# Patient Record
Sex: Female | Born: 1935 | Race: White | Hispanic: No | Marital: Married | Smoking: Former smoker
Health system: Southern US, Community
[De-identification: ages and names within clinical notes are randomized; demographics above are authoritative.]

## PROBLEM LIST (undated history)

## (undated) DIAGNOSIS — R569 Unspecified convulsions: Secondary | ICD-10-CM

## (undated) DIAGNOSIS — Z9289 Personal history of other medical treatment: Secondary | ICD-10-CM

## (undated) DIAGNOSIS — I10 Essential (primary) hypertension: Secondary | ICD-10-CM

## (undated) DIAGNOSIS — C801 Malignant (primary) neoplasm, unspecified: Secondary | ICD-10-CM

## (undated) DIAGNOSIS — I639 Cerebral infarction, unspecified: Secondary | ICD-10-CM

## (undated) DIAGNOSIS — K219 Gastro-esophageal reflux disease without esophagitis: Secondary | ICD-10-CM

## (undated) DIAGNOSIS — I509 Heart failure, unspecified: Secondary | ICD-10-CM

## (undated) DIAGNOSIS — G4730 Sleep apnea, unspecified: Secondary | ICD-10-CM

## (undated) DIAGNOSIS — D649 Anemia, unspecified: Secondary | ICD-10-CM

## (undated) DIAGNOSIS — E559 Vitamin D deficiency, unspecified: Secondary | ICD-10-CM

## (undated) DIAGNOSIS — M1990 Unspecified osteoarthritis, unspecified site: Secondary | ICD-10-CM

## (undated) DIAGNOSIS — E039 Hypothyroidism, unspecified: Secondary | ICD-10-CM

## (undated) DIAGNOSIS — E079 Disorder of thyroid, unspecified: Secondary | ICD-10-CM

## (undated) DIAGNOSIS — R0602 Shortness of breath: Secondary | ICD-10-CM

## (undated) DIAGNOSIS — R7309 Other abnormal glucose: Secondary | ICD-10-CM

## (undated) DIAGNOSIS — I609 Nontraumatic subarachnoid hemorrhage, unspecified: Secondary | ICD-10-CM

## (undated) DIAGNOSIS — E785 Hyperlipidemia, unspecified: Secondary | ICD-10-CM

## (undated) DIAGNOSIS — G2581 Restless legs syndrome: Secondary | ICD-10-CM

## (undated) HISTORY — PX: TONSILLECTOMY: SUR1361

## (undated) HISTORY — PX: KNEE SURGERY: SHX244

## (undated) HISTORY — PX: EYE SURGERY: SHX253

## (undated) HISTORY — DX: Hypothyroidism, unspecified: E03.9

## (undated) HISTORY — PX: OTHER SURGICAL HISTORY: SHX169

## (undated) HISTORY — DX: Nontraumatic subarachnoid hemorrhage, unspecified: I60.9

## (undated) HISTORY — DX: Restless legs syndrome: G25.81

## (undated) HISTORY — PX: ABDOMINAL HYSTERECTOMY: SHX81

## (undated) HISTORY — DX: Vitamin D deficiency, unspecified: E55.9

## (undated) HISTORY — DX: Hyperlipidemia, unspecified: E78.5

## (undated) HISTORY — DX: Other abnormal glucose: R73.09

---

## 1958-10-25 HISTORY — PX: APPENDECTOMY: SHX54

## 1971-10-26 DIAGNOSIS — I609 Nontraumatic subarachnoid hemorrhage, unspecified: Secondary | ICD-10-CM

## 1971-10-26 HISTORY — DX: Nontraumatic subarachnoid hemorrhage, unspecified: I60.9

## 2000-05-03 ENCOUNTER — Ambulatory Visit (HOSPITAL_COMMUNITY): Admission: RE | Admit: 2000-05-03 | Discharge: 2000-05-03 | Payer: Self-pay | Admitting: Internal Medicine

## 2000-05-03 ENCOUNTER — Encounter: Payer: Self-pay | Admitting: Internal Medicine

## 2001-09-14 ENCOUNTER — Other Ambulatory Visit: Admission: RE | Admit: 2001-09-14 | Discharge: 2001-09-14 | Payer: Self-pay | Admitting: Internal Medicine

## 2001-12-13 ENCOUNTER — Encounter (INDEPENDENT_AMBULATORY_CARE_PROVIDER_SITE_OTHER): Payer: Self-pay

## 2001-12-13 ENCOUNTER — Ambulatory Visit (HOSPITAL_COMMUNITY): Admission: RE | Admit: 2001-12-13 | Discharge: 2001-12-13 | Payer: Self-pay | Admitting: Gastroenterology

## 2003-03-05 ENCOUNTER — Ambulatory Visit (HOSPITAL_COMMUNITY): Admission: RE | Admit: 2003-03-05 | Discharge: 2003-03-05 | Payer: Self-pay | Admitting: Gastroenterology

## 2003-03-05 ENCOUNTER — Encounter (INDEPENDENT_AMBULATORY_CARE_PROVIDER_SITE_OTHER): Payer: Self-pay | Admitting: Specialist

## 2004-09-22 ENCOUNTER — Ambulatory Visit (HOSPITAL_COMMUNITY): Admission: RE | Admit: 2004-09-22 | Discharge: 2004-09-22 | Payer: Self-pay | Admitting: Internal Medicine

## 2004-12-14 ENCOUNTER — Ambulatory Visit (HOSPITAL_COMMUNITY): Admission: RE | Admit: 2004-12-14 | Discharge: 2004-12-14 | Payer: Self-pay | Admitting: Internal Medicine

## 2006-08-10 ENCOUNTER — Ambulatory Visit (HOSPITAL_COMMUNITY): Admission: RE | Admit: 2006-08-10 | Discharge: 2006-08-10 | Payer: Self-pay | Admitting: Internal Medicine

## 2006-08-21 ENCOUNTER — Encounter: Admission: RE | Admit: 2006-08-21 | Discharge: 2006-08-21 | Payer: Self-pay | Admitting: Orthopedic Surgery

## 2008-07-11 ENCOUNTER — Encounter (INDEPENDENT_AMBULATORY_CARE_PROVIDER_SITE_OTHER): Payer: Self-pay | Admitting: Neurology

## 2008-07-11 ENCOUNTER — Inpatient Hospital Stay (HOSPITAL_COMMUNITY): Admission: EM | Admit: 2008-07-11 | Discharge: 2008-07-16 | Payer: Self-pay | Admitting: Emergency Medicine

## 2008-07-11 ENCOUNTER — Ambulatory Visit: Payer: Self-pay | Admitting: Internal Medicine

## 2008-07-12 ENCOUNTER — Ambulatory Visit: Payer: Self-pay | Admitting: *Deleted

## 2008-07-12 ENCOUNTER — Encounter (INDEPENDENT_AMBULATORY_CARE_PROVIDER_SITE_OTHER): Payer: Self-pay | Admitting: Neurology

## 2008-07-15 ENCOUNTER — Encounter (INDEPENDENT_AMBULATORY_CARE_PROVIDER_SITE_OTHER): Payer: Self-pay | Admitting: Neurology

## 2008-07-17 ENCOUNTER — Emergency Department (HOSPITAL_COMMUNITY): Admission: EM | Admit: 2008-07-17 | Discharge: 2008-07-17 | Payer: Self-pay | Admitting: Emergency Medicine

## 2008-07-24 ENCOUNTER — Inpatient Hospital Stay (HOSPITAL_COMMUNITY): Admission: EM | Admit: 2008-07-24 | Discharge: 2008-07-27 | Payer: Self-pay | Admitting: Emergency Medicine

## 2008-07-24 ENCOUNTER — Encounter: Payer: Self-pay | Admitting: Internal Medicine

## 2008-08-28 ENCOUNTER — Encounter: Payer: Self-pay | Admitting: Neurology

## 2008-09-01 ENCOUNTER — Inpatient Hospital Stay (HOSPITAL_COMMUNITY): Admission: EM | Admit: 2008-09-01 | Discharge: 2008-09-06 | Payer: Self-pay | Admitting: Emergency Medicine

## 2008-09-05 ENCOUNTER — Encounter: Payer: Self-pay | Admitting: Interventional Cardiology

## 2008-09-11 ENCOUNTER — Inpatient Hospital Stay (HOSPITAL_COMMUNITY): Admission: EM | Admit: 2008-09-11 | Discharge: 2008-09-17 | Payer: Self-pay | Admitting: Emergency Medicine

## 2009-07-03 ENCOUNTER — Ambulatory Visit (HOSPITAL_COMMUNITY): Admission: RE | Admit: 2009-07-03 | Discharge: 2009-07-03 | Payer: Self-pay | Admitting: Internal Medicine

## 2009-09-15 ENCOUNTER — Ambulatory Visit (HOSPITAL_COMMUNITY): Admission: RE | Admit: 2009-09-15 | Discharge: 2009-09-15 | Payer: Self-pay | Admitting: Internal Medicine

## 2009-10-03 ENCOUNTER — Ambulatory Visit (HOSPITAL_COMMUNITY): Admission: RE | Admit: 2009-10-03 | Discharge: 2009-10-03 | Payer: Self-pay | Admitting: Internal Medicine

## 2010-04-23 ENCOUNTER — Ambulatory Visit (HOSPITAL_COMMUNITY): Admission: RE | Admit: 2010-04-23 | Discharge: 2010-04-23 | Payer: Self-pay | Admitting: Orthopedic Surgery

## 2010-06-20 IMAGING — RF DG BE W/ CM - WO/W KUB
13 of 19 series · 13 of 19 positions shown · IV contrast (agent unspecified)
Comparison: No priors

CLINICAL DATA: Chronic ileus

SINGLE CONTRAST BARIUM ENEMA
Fluoroscopy Time: 2.2 minutes
Contrast: 450 ml Amnipaque-H11 plus 4171 ml water

[Series 1: run · 1 of 1 slices shown (1 of 8)]
[im 1/1]
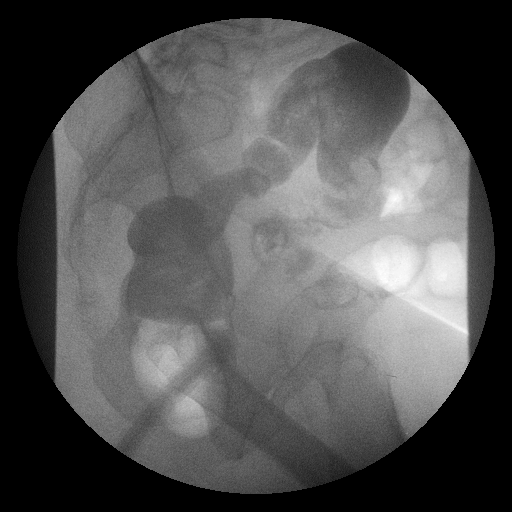

[Series 3: run · 1 of 1 slices shown (2 of 8)]
[im 1/1]
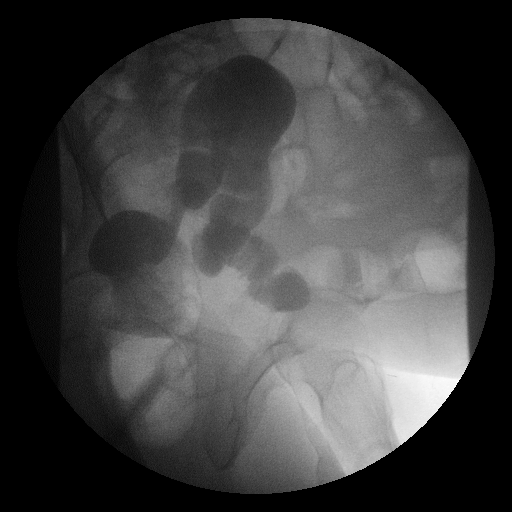

[Series 4: run · 1 of 1 slices shown (3 of 8)]
[im 1/1]
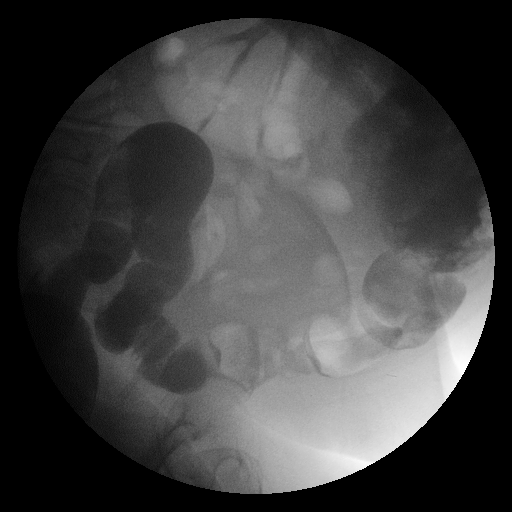

[Series 6: run · 1 of 1 slices shown (4 of 8)]
[im 1/1]
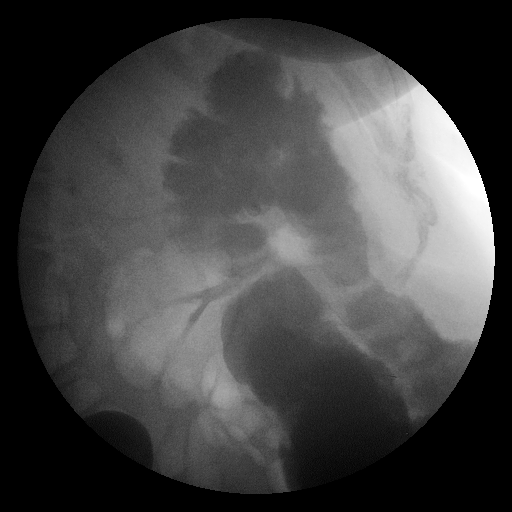

[Series 7: run · 1 of 1 slices shown (5 of 8)]
[im 1/1]
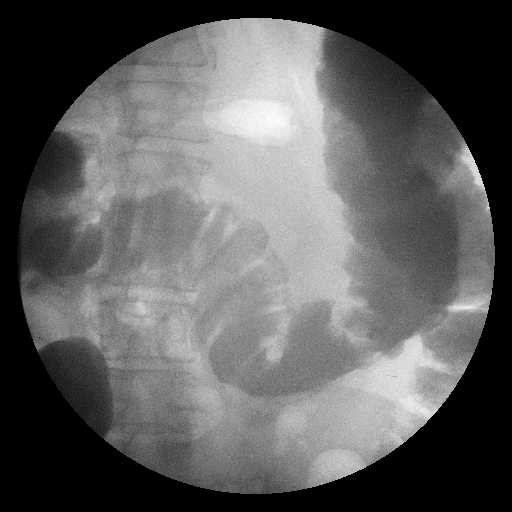

[Series 9: run · 1 of 1 slices shown (6 of 8)]
[im 1/1]
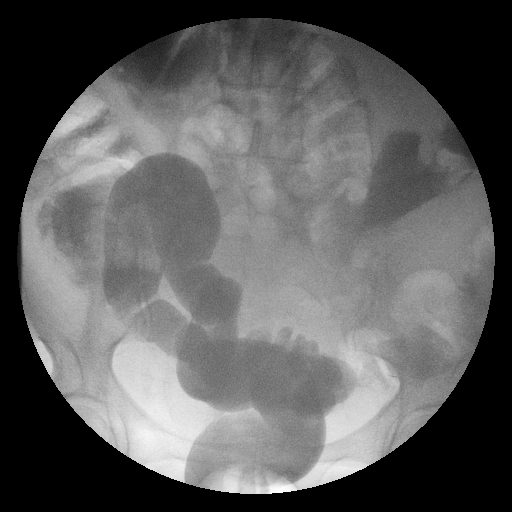

[Series 10: run · 1 of 1 slices shown (7 of 8)]
[im 1/1]
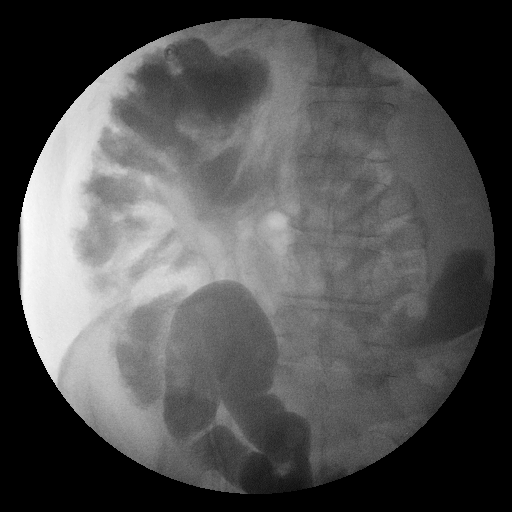

[Series 11: run · 1 of 1 slices shown (8 of 8)]
[im 1/1]
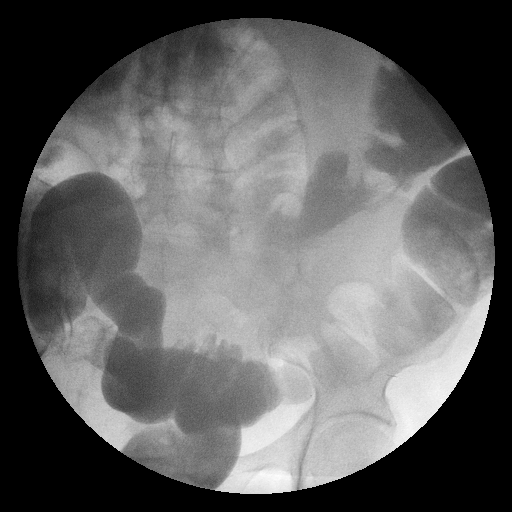

[Series 1002: view not recorded · 0.20mm/px · 1 of 1 slices shown (1 of 5)]
[im 1/1]
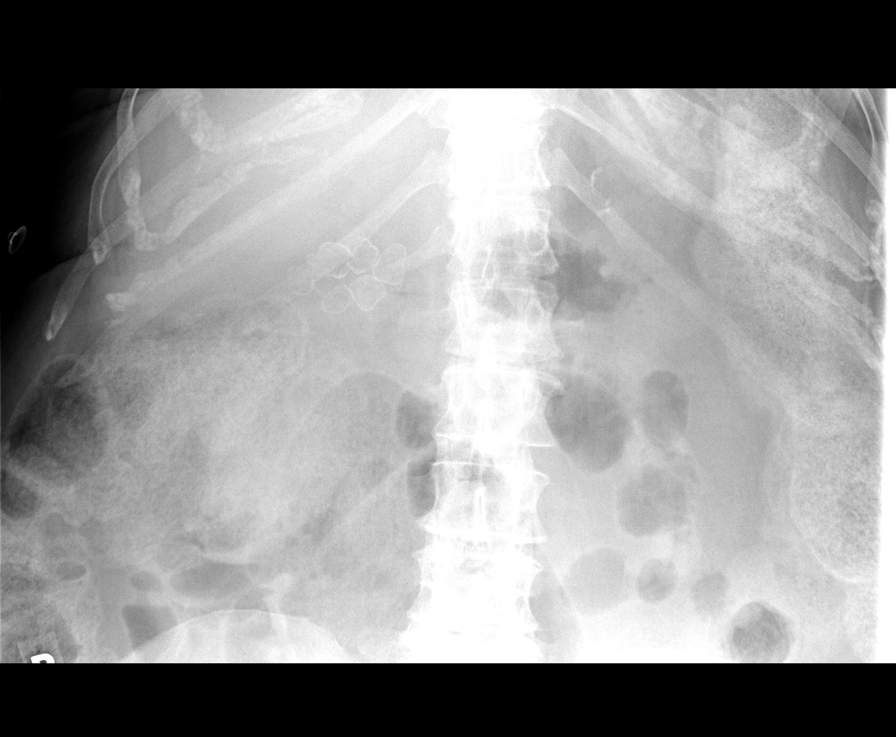

[Series 1003: view not recorded · 0.20mm/px · 1 of 1 slices shown (2 of 5)]
[im 1/1]
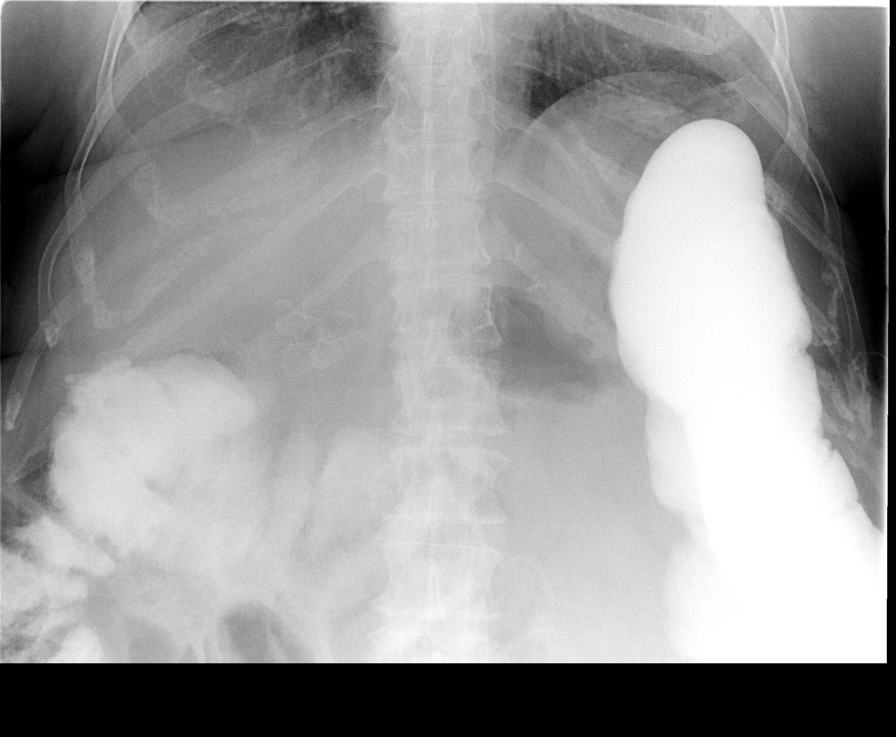

[Series 1005: view not recorded · 0.20mm/px · 1 of 1 slices shown (3 of 5)]
[im 1/1]
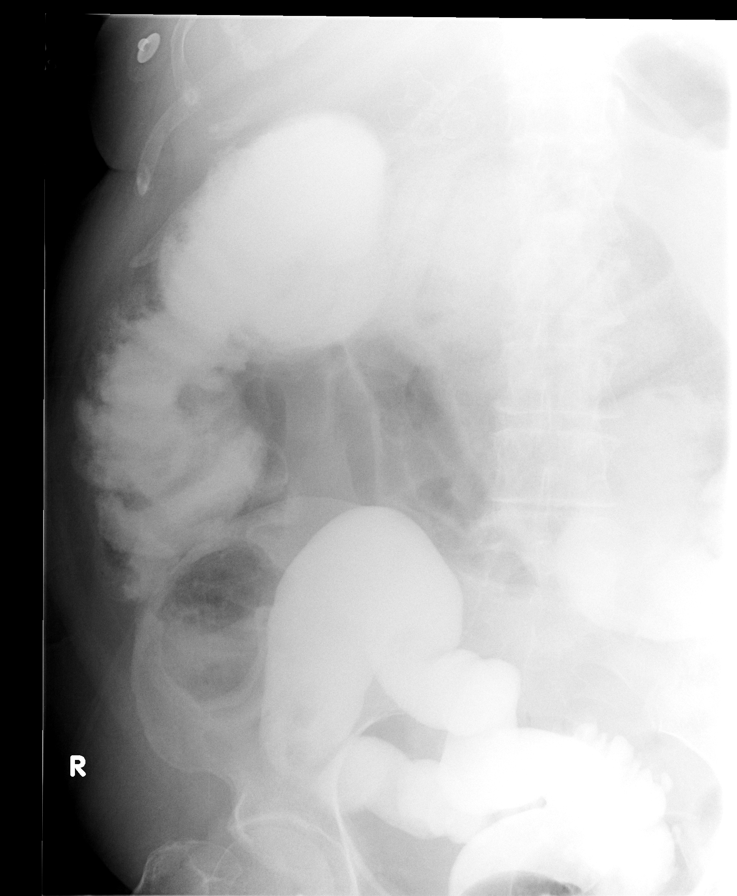

[Series 1006: view not recorded · 0.20mm/px · 1 of 1 slices shown (4 of 5)]
[im 1/1]
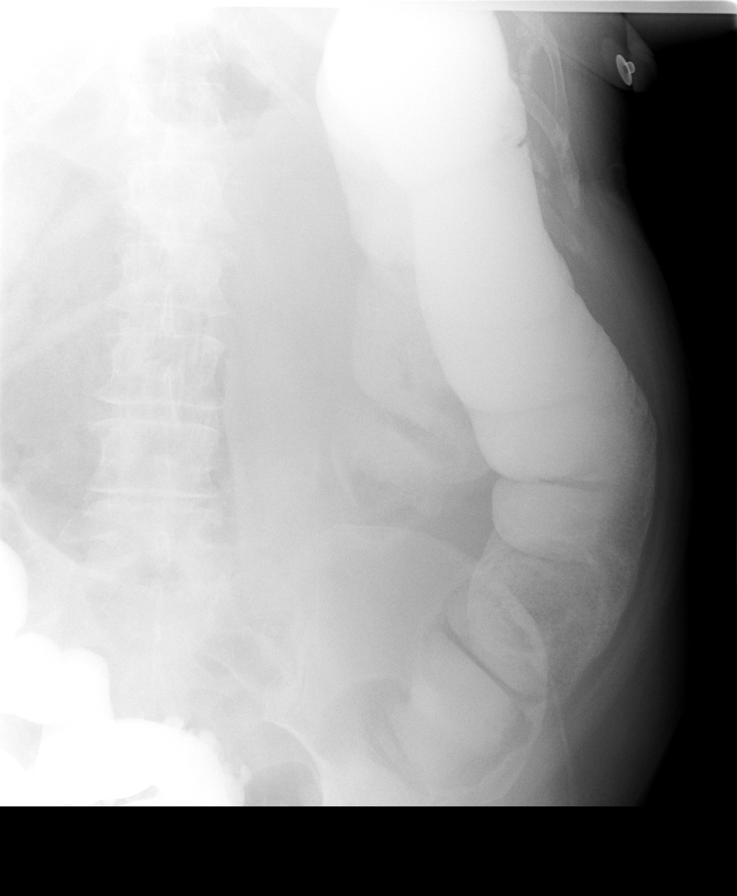

[Series 1008: view not recorded · 0.20mm/px · 1 of 1 slices shown (5 of 5)]
[im 1/1]
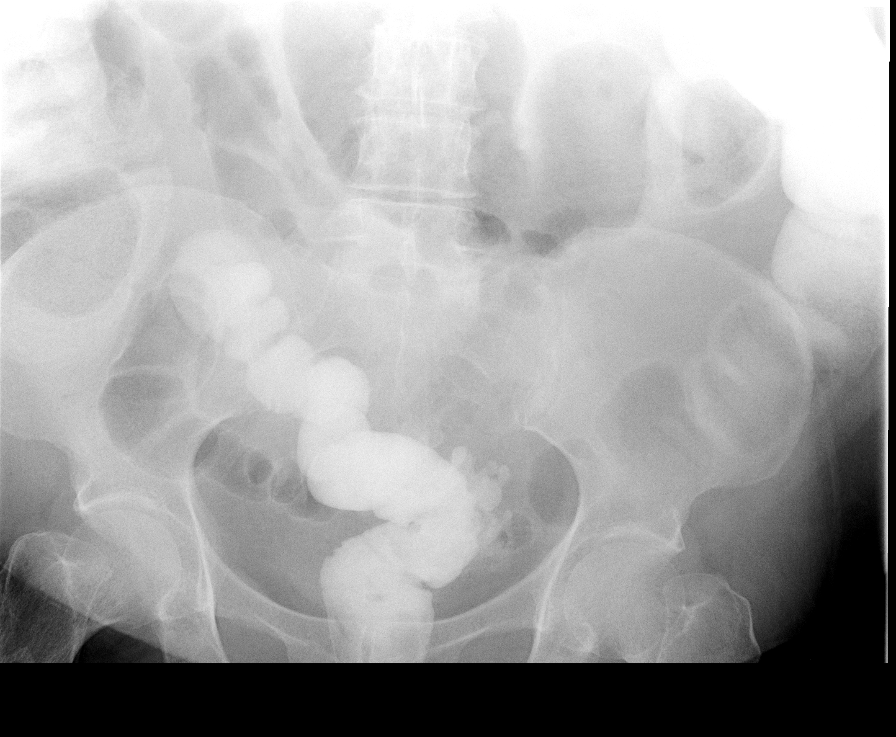

[13 of 19 positions shown; findings below may reference images not displayed]

FINDINGS: Scout KUB shows generalized, nonspecific dilatation of
the colon with stool and air.  There are also multiple gallstones.

Water-soluble contrast was advanced from the rectum to the cecum
without obstruction or significant delay.  There are no gross
constricting lesions to suggest malignancy.  There are a few
diverticula in the sigmoid region.  No reflux into the terminal
ileum.
IMPRESSION: 1.  Cholelithiasis.
2.  Unprepped water soluble enema shows no obstruction or gross
lesions other than a few sigmoid diverticula.

## 2011-01-10 LAB — CBC
HCT: 43.1 % (ref 36.0–46.0)
MCH: 28.8 pg (ref 26.0–34.0)
MCV: 87 fL (ref 78.0–100.0)
Platelets: 226 10*3/uL (ref 150–400)
RDW: 16.3 % — ABNORMAL HIGH (ref 11.5–15.5)

## 2011-01-10 LAB — PROTIME-INR
INR: 0.95 (ref 0.00–1.49)
Prothrombin Time: 12.6 seconds (ref 11.6–15.2)

## 2011-01-10 LAB — BASIC METABOLIC PANEL
BUN: 9 mg/dL (ref 6–23)
CO2: 34 mEq/L — ABNORMAL HIGH (ref 19–32)
GFR calc Af Amer: 56 mL/min — ABNORMAL LOW (ref >60)

## 2011-01-10 LAB — SURGICAL PCR SCREEN
MRSA, PCR: NEGATIVE
Staphylococcus aureus: NEGATIVE

## 2011-03-09 NOTE — Discharge Summary (Signed)
[name], [name]              ACCOUNT NO.:  [id number]   MEDICAL RECORD NO.:  [id number]          PATIENT TYPE:  INP   LOCATION:  3001                         FACILITY:  [facility]   PHYSICIAN:  Pramod P. Pearlean Brownie, MD    DATE OF BIRTH:  1931-08-09   DATE OF ADMISSION:  07/11/2008  DATE OF DISCHARGE:  07/16/2008                               DISCHARGE SUMMARY   DIAGNOSES AT THE TIME OF DISCHARGE:  1. 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  2. Paroxysmal atrial fibrillation, now on Coumadin.  3. Question diastolic dysfunction.  4. Dyslipidemia.  5. Obesity.  6. Hypertension.  7. Hypothyroidism status post thyroidectomy due to thyroid carcinoma      and partial vocal cord resection.  8. Degenerative arthritis of the knees.  9. Bilateral cataract surgery.  10.History of intracranial hemorrhage 30 years ago, question cerebral      aneurysm versus other source of hemorrhage.  The patient never      required any surgery or intervention.   MEDICINES AT THE TIME OF DISCHARGE:  1. Coumadin per pharmacy protocol.  2. Synthroid 0.075 mg a day.  3. Estradiol 1 mg a day.  4. Hydrochlorothiazide 25 mg a day.  5. Oxybutynin 10 mg a day.  6. Vitamin D 2000 International Units a day.  7. Crestor 20 mg a day.  8. Benicar 20 mg a day.  9. Diclofenac 75 mg twice a day.  10.Diltiazem SR 240 mg a day.  11.Bactrim DS 1 p.o. b.i.d. through July 17, 2008, p.m. second      dose.   STUDIES PERFORMED:  1. CT of the brain on admission shows no intracranial hemorrhage,      moderate small vessel disease type changes, question of slight      density in left MCA branch vessel.  2. MRI of the brain shows moderately large acute infarct in the left      temporoparietal cortex with a mild amount of associated hemorrhage      in the infarct.  3. MRA of the brain shows occlusion of the posterior branch of the      posterior division of the left MCA.  4. MRA of the neck shows high-grade stenosis of proximal right      internal carotid artery 90% stenosis, 80% diameter stenosis of      proximal left vertebral artery, mild stenosis of proximal left      internal carotid artery, possible stenosis in proximal right      subclavian artery.  5. Followup CT 24 hours after t-PA shows acute hemorrhagic infarct      involving the posterior division of the left MCA.  6. A 2-D echocardiogram shows EF 55-65%, cannot fully evaluate      regional wall motion due to poor acoustic windows.  7. Transesophageal echocardiogram shows no intracardiac shunts.  The      patient had a new AFib during the study, fixed atherosclerotic      plaque of the thoracic aorta.  8.  Carotid Doppler shows mixed plaque throughout bilaterally.      Vertebral flow antegrade bilaterally.  No significant right or left      ICA stenosis noted.  9. EKG shows sinus bradycardia with marked sinus arrhythmia on      admission.  EKG on July 15, 2008, shows atrial fibrillation.  10.EEG performed with no epileptiform activities seen.  11.Transcranial Doppler performed, results pending.   LABORATORY STUDIES:  Urine culture, 6000 colonies insignificant growth.  Homocystine 13.4.  Urinalysis had 3-6 white blood cells, 21-50 red blood  cells, bacteria rare.  Hemoglobin A1c 6.0.  Lipid profile, cholesterol  158, triglycerides 119, HDL 57, LDL 77.  Chemistry with glucose 112,  creatinine 1.37.  CBC with white blood cells 11.5, otherwise, normal.  Cardiac enzymes negative.  Coags on admission with INR 1.7, protime  20.7, and PTT 45.  Please note initial white blood cells were 17.1.   HISTORY OF PRESENT ILLNESS:  [name] is a 77 year old right-  handed Caucasian female with a history of hypertension, obesity, and  dyslipidemia who is followed by Dr. Lucky Cowboy.  The patient  presented to [facility] after has been  noted onset of  problems with right-sided deficit, speech difficulty at 5:30 p.m.  They  had just finished the dinner and the patient had taken her coffee into  the living room and the patient's husband was finishing the dishes.  When he went into the room moments later, the patient was on the couch  with her eyes closed.  She was trembling with the left arm but unable to  speak.  He called EMS.  The patient was brought to the emergency room  and was found to have an aphasia, right facial apraxia of right upper  extremity and right hemisensory deficit.  CT of the brain was completed,  which showed evidence of bihemispheric small vessel disease.  No acute  changes.  No hemorrhage was seen.  NIH Stroke Scale was 5.  The patient  was felt to be a candidate for t-PA and full-dose IV t-PA was given.  She was admitted to the Neuro Intensive Care for further evaluation.   HOSPITAL COURSE:  MRI done, did confirm a left temporal MCA branch  infarct.  CT done 24 hours after t-PA administration showed some very  mild hemorrhagic transformation but no significant hemorrhage.  The  patient was started on Aggrenox for secondary stroke prevention.  Of  note, she had increased white blood cell count  in her urine and blood.  She was started on Bactrim for urinary tract infection though cultures  grew out only 6000 colonies.  As the patient is missing only 3 doses of  complete course, we will continue until course is finished.  Etiology of  stroke was felt to be embolic.  She was sent for a transesophageal  echocardiogram during which time she went to paroxysmal atrial  fibrillation during the procedure.  Cardiology consult was obtained with  [facility] and they felt the atrial fibrillation was the  etiology of her stroke.  The patient initially did not desire Coumadin  though after discussing treatment options she has opted for Coumadin.  She wishes for Dr. Oneta Rack to follow this up.  We will  follow with Dr.  Oneta Rack INR check on July 18, 2008, follow up with Dr. Verdis Prime  for AFib and diastolic dysfunction at the patient's request on July 25, 2008, and will follow up with  Dr. Pearlean Brownie in 2-3 months.  She was  evaluated also by PT and OT and felt definitely needs outpatient  occupational therapy and short-term physical therapy have made  recommendations for outpatient followup.  Of note, INR was 1.7 on  admission though the patient was not on Coumadin.   CONDITION AT DISCHARGE:  The patient is alert and oriented x3.  Speech  clear.  No facial weakness.  Extraocular movements intact.  Visual  fields are full.  She has no arm drift.  She has right upper extremity  with sensory ataxia.  She has decreased fine motor movement on the  right.  Her left arm does have __________.  Her gait is steady.  Her  lower extremity strength is normal.  Her lower extremity sensation is  intact.  Her heart rate is irregularly irregular.   DISCHARGE PLAN:  1. Discharge home with family.  2. Coumadin for secondary stroke prevention.  No bridging.  3. Follow up with Dr. Oneta Rack on July 18, 2008, for INR check.      Of note, INR was 1.7 upon admission to hospital though the patient      was not on Coumadin.  4. Follow up with Dr. Verdis Prime for paroxysmal atrial fib and      diastolic dysfunction at patient's request on July 25, 2008, at      1:15.  5. Follow up with Dr. Delia Heady in 2-3 months.  6. Consider high risk trial.  7. Outpatient PT and OT at rehab on [facility].      Annie Main, N.P.    ______________________________  Sunny Schlein. Pearlean Brownie, MD    SB/MEDQ  D:  07/16/2008  T:  07/17/2008  Job:  [id number]   cc:   Outpatient Rehab on [address]  Lyn Records, M.D.  Lucky Cowboy, M.D.

## 2011-03-09 NOTE — Procedures (Signed)
EEG NUMBER:  [id number].   REFERRING PHYSICIAN:  Marlan Palau, MD   CLINICAL HISTORY:  This is a 77 year old lady being evaluated for  stroke.   MEDICATION LISTED:  Diltiazem, hydrochlorothiazide, levothyroxine,  Benicar, Crestor.   This is a 17-channel portable EEG recorded with the patient awake and  alert using standard 10/20 electrode placement.   Background awake rhythm consists of 9-10 Hz alpha, which is of moderate  amplitude, synchronous, reactive to eye opening and closure.  No  definite epileptiform activities, spikes, or sharp waves are seen.  Definite sleep stages are not seen in this tracing.  Length of the  recording is 22.3 minutes.  Technical component is average.  EKG tracing  reveals regular sinus rhythm.  Hyperventilation is not performed.  Photic stimulation is unremarkable.   IMPRESSION:  This EEG performed during awake state is within normal  limits.  No definite epileptiform features are noted.           ______________________________  Sunny Schlein. Pearlean Brownie, MD     QVZ:DGLO  D:  07/12/2008 17:28:45  T:  07/13/2008 02:50:52  Job #:  [id number]

## 2011-03-09 NOTE — Discharge Summary (Signed)
NAMECATY, TESSLER NO.:  [id number]   MEDICAL RECORD NO.:  [id number]          PATIENT TYPE:  INP   LOCATION:  4705                         FACILITY:  [facility]   PHYSICIAN:  Lyn Records, M.D.   DATE OF BIRTH:  November 30, 1930   DATE OF ADMISSION:  09/11/2008  DATE OF DISCHARGE:  09/17/2008                               DISCHARGE SUMMARY   DISCHARGE DIAGNOSES:  1. Chest pain, resolved.  2. Nonobstructive coronary artery disease.  3. Left arm jerking, concern for seizure disorder, seen by Dr. Lesia Sago, and put on Keppra.  4. Chronic atrial fibrillation.  5. Long-term Coumadin use.  6. Restless leg syndrome, placed on ReQuip.  7. History of cerebrovascular accident.  8. Hyperlipidemia.  9. History of diastolic heart failure.  10.Recent pneumonia.  11.Recent acute renal failure.  12.Status post thyroidectomy with replacement of therapy.  This was      done because of thyroid cancer.  She did not have any chemotherapy      or radiation therapy.  13.Obesity.  14.Anxiety.   HOSPITAL COURSE:  Ms. Orama is a 77 year old female who complained of  bilateral leg heaviness the day before admission.  She has been awoke on  the day of admission began having chest pain and left arm pain.  She  describes as shakiness, has been says that the left arm with jerky and  could not control for about 30-45 minutes.  As far as chest pain, EMS  was called and the chest pain subsided with sublingual nitroglycerin  plus aspirin.   The patient was just discharged about a week prior to admission  secondary to the diastolic heart failure and pneumonia.  During that  admission, she had chest pain and therefore, has had a Cardiolite done  that showed apical scar   The patient was admitted to [facility] and with the left arm  shakiness, we were concerned that the patient had a seizure.  Dr. Lesia Sago of the Neurology Service saw the patient and recommended an MRI  of  the brain.  The MRI showed a chronic hemorrhagic infarct of the left  parietal lobe and no acute infarct was identified.  This was otherwise  negative.  The EEG was apparently negative.  The patient was placed on  Keppra with no further incidents.   As far as her heart, she ultimately underwent cardiac catheterization,  which showed the following:  Less than 50% mid RCA lesion, ostial 20%  LAD lesion with a proximal lesion of 50%, proximal lesion of the  circumflex just after the OM1.  There was no significant obstruction.  The LV was normal.  There was calcification of the left and right  coronary arteries.  The patient was felt to be ready for discharge to  home on September 17, 2008.   The patient was on Coumadin long-term.  She had a BUN reverse vitamin K.  Her INR on the day of cath was 1.5.  We will give her 10 mg of Coumadin  today and then ask her to sound  and then 2.5 mg the day as prior to  admission.  She was then to return for Coumadin check next week.   Lab studies during the patient's hospitalization include hemoglobin  10.3, hematocrit 30.5, platelet 496, white count 10.6.  Protime 18.4,  INR 1.5.  Sodium 135, potassium 3.9, BUN 13, creatinine 1.15, BNP 182.  Chest x-ray shows no active disease.  EKG shows atrial fibrillation,  rate 61 with no acute ST-T wave changes.   DISCHARGE MEDICATIONS:  1. Coumadin as prior to admission, of which, she was on 2.5 mg a day      at home.  2. Estradiol 1 mg a day.  3. Protonix 40 mg a day.  4. Synthroid 75 mcg a day.  5. MiraLax 17 g daily.  6. Ziac 1 tablet daily.  7. Keppra 500 mg every 12 hours, this is new.  8. Crestor 40 mg a day.  9. Cardizem CD 180 mg, she did take it twice a day.  We were asking      her to only take it once a day.  10.Lasix 40 mg a day.  11.ReQuip 1 mg at bedtime for restless leg syndrome.  12.Imdur 30 mg a day and Benicar 20 mg a day.  These two drugs are to      be stopped.  13.She is to continue  vitamin D daily.   As far as her Imdur and Benicar, it seems that her blood pressure had  become low in that.  These were stopped for this reason.  Her creatinine  had remained stable during her hospitalization.   DISCHARGE INSTRUCTIONS:  She is to remain on a low-sodium heart-healthy  diet.  Increase activity slowly.  Clean cath site gently with soap and  water.  Follow up with Dr. Lesia Sago, call for an appointment at [phone].  Follow up with Dr. Katrinka Blazing on October 01, 2008 at 2:15 p.m.  Follow  up with [facility] on September 23, 2008 at 2:15 p.m.Guy Franco, P.A.      Lyn Records, M.D.  Electronically Signed    LB/MEDQ  D:  09/17/2008  T:  09/17/2008  Job:  [id number]   cc:   Lucky Cowboy, M.D.

## 2011-03-09 NOTE — Consult Note (Signed)
NAMEJORDYNE, [name]              ACCOUNT NO.:  [id number]   MEDICAL RECORD NO.:  [id number]          PATIENT TYPE:  INP   LOCATION:  1406                         [facility]   PHYSICIAN:  James L. Malon Kindle., M.D.DATE OF BIRTH:  1931/08/05   DATE OF CONSULTATION:  DATE OF DISCHARGE:                                 CONSULTATION   PRIMARY CARE PHYSICIAN:  Dr. Lucky Cowboy.   PRIMARY GASTROENTEROLOGIST:  Dr. Vida Rigger.   REASON FOR CONSULTATION:  Possibly obstructive colon versus ileus.   HISTORY:  Patient is a 77 year old white female who was hospitalized at  [name] from July 11, 2008, throughout July 16, 2008, with  a left middle cerebral artery embolic infarct secondary to atrial  fibrillation which was a new diagnosis for her.  She was discharged on  Coumadin.  The patient notes that she is normally somewhat constipated  with constipation alternating with loose stools and has this on a fairly  regular basis.  For 2 weeks or so, she has not had a spontaneous bowel  movement.  Apparently, the whole time she was in with a stroke she did  not have a bowel movement and had not had a bowel movement prior to  this.  She talked to her primary care physician late last week and over  the weekend has had multiple doses of MiraLax, enemas, Dulcolax, and Ex-  Lax all without any significant results.  She continued to have fairly  significant abdominal pain and presented to the emergency room after  being sent over for abdominal films suggestive of colonic obstruction.  Patient's husband notes that yesterday evening her stomach was much more  distended and tight as drum.  It is somewhat better today.  The  patient again has not had a decent bowel movement in some time but has  had some flatus which she states is more malodorous than unusual.  When  she was sent over from the x-ray facility, pertinent lab results  revealed normal a potassium at 5.1, lipase was normal,  prothrombin time  was elevated at 31, white count was elevated 13.7.  She had a CT scan  which showed a marked thickening of a localized area in the descending  colon with proximal dilatation following this down the CT scan.  The  colon diameter is much smaller following the thickened area.  In the  sigmoid, she has a few diverticula but no gross diverticulitis.  She  also has gallstones.  She does clinically feeling better than she did  yesterday.   MEDICATIONS ON ADMISSION:  1. Coumadin.  2. Synthroid.  3. Estradiol.  4. Hydrochlorothiazide.  5. Oxybutynin.  6. Vitamin D.  7. Crestor.  8. Benicar.  9. Diclofenac.  10.Diltiazem.  11.Bactrim.   She has no drug allergies.   MEDICAL HISTORY:  1. History of colon polyps.  She had a colonoscopy in 2004 with some      diverticula noted and a few small polyps removed by Dr. Ewing Schlein.  She      is on a 5-year schedule.  2. She had a  history of a recent embolic left middle cerebral artery      stroke secondary to new onset of atrial fibrillation.  3. She has hyperlipidemia.  4. Hypertension.  5. Hypothyroidism.  6. She has had a previous thyroidectomy due to thyroid carcinoma and      is on thyroid replacement.  7. She has had cataract surgery.  8. She has a history of 30 years or so ago of an intracranial      hemorrhage of unclear cause.  9. She has seen Dr. Darci Needle for atrial fibrillation and will      follow up with him.   SURGERIES:  Include:  1. Tonsillectomy.  2. Thyroidectomy due to thyroid cancer.   FAMILY HISTORY:  Brother died of colon cancer.  This is what has  precipitated her routine colonoscopies.   SOCIAL HISTORY:  She is married; her husband is here with her today.  She does not smoke or drink.   PHYSICAL EXAM.:  VITAL SIGNS:  Unremarkable.  GENERAL:  An alert, obese, white female in acute distress.  LUNGS:  Clear.  HEART:  Regular rate and rhythm without murmurs or gallops.  ABDOMEN:  Distended  with mild lower tenderness with bowel sounds  present.  There is no gross distention although it is slightly  distended.   ASSESSMENT:  Colonic ileus versus partial obstruction.  This is in the  face of no bowel movement for 2 weeks and may well be that she simply  has an area of ischemia due to severe constipation.  We cannot entirely  rule out a colon cancer but the film does not look consistent with that  and she has had colonoscopies last done approximately 5 years ago.   PLAN:  At this point, I would keep her n.p.o. other than ice chips.  We  will obtain a gastrograph and enema in the morning which would be  diagnostic and identify exactly where the narrowing is and would likely  be therapeutic as well to get her bowels cleaned out more.  We will  follow her along with you.           ______________________________  Llana Aliment. Malon Kindle., M.D.     Waldron Session  D:  07/24/2008  T:  07/25/2008  Job:  [id number]   cc:   Lucky Cowboy, M.D.  Fax: [phone]   Petra Kuba, M.D.  Fax: [phone]

## 2011-03-09 NOTE — H&P (Signed)
NAMESAAVI, [name]              ACCOUNT NO.:  [id number]   MEDICAL RECORD NO.:  [id number]          PATIENT TYPE:  EMS   LOCATION:  ED                           FACILITY:  [facility]   PHYSICIAN:  Estelle Grumbles, MDDATE OF BIRTH:  October 20, 1931   DATE OF ADMISSION:  09/01/2008  DATE OF DISCHARGE:                              HISTORY & PHYSICAL   PRIMARY CARE PHYSICIAN:  Hansel Feinstein, M.D.   CARDIOLOGIST:  Lyn Records, M.D.   NEUROLOGIST:  Bevelyn Buckles. Champey, M.D.   CHIEF COMPLAINT:  Chest pain and dyspnea.   HISTORY OF PRESENT ILLNESS:  [name] is a 77 year old female with  history of hypertension, recent stroke, chronic atrial fibrillation who  presented to the emergency room with dyspnea.  The patient states she  was discharged from the hospital 2 weeks ago.  At that time she was  admitted with constipation, and she has been doing well.  She gained  some weight in the last two weeks; she gained approximately 6 pounds of  weight.  Last night around 1 o'clock in the morning, she woke up with  dyspnea and wheezing.  Subsequently she had 1 episode of chest pain  while she was waiting in the emergency room.  The patient was also  complaining of having chest tightness overnight.  In the ED, she  received IV Lasix.  Currently she denies having any dyspnea or chest  pain.  Per the ED physician, initially her pulse oximetry was 90% on  room air.  Currently she is on 2 liters oxygen via nasal cannula.  The  patient denies having any headache.  However, recently she has been  feeling weak.  This morning also she was complaining of feeling drained.  Denies having any recent fevers, no cough.  Denies having any prior  episodes of chest pain.  No orthopnea.  Denies having nausea, vomiting,  abdominal pain, diarrhea.  Recently she had severe constipation.  Currently she is taking MiraLax with good bowel movement.  Denies having  any focal weakness, tingling, numbness.  Two days  ago she had one  episode of chest tightness with left upper extremity tingling sensation.  Her cardiologist was consulted at that time, and she was started on  bisoprolol with hydrochlorothiazide.   REVIEW OF SYSTEMS:  Review of Systems negative.   PAST MEDICAL HISTORY:  1. As mentioned, atrial fibrillation with rate control.  2. Recent embolic CVA.  3. Hypertension.  4. Diastolic dysfunction.  5. Hypothyroidism.  6. Dyslipidemia.  7. Obesity.  8. Anxiety.   PAST SURGICAL HISTORY:  She had thyroidectomy with vocal cord dissection  for thyroid carcinoma.   SOCIAL HISTORY:  Denies smoking, alcohol, drug use.  She quit smoking  approximately 30 years ago.   FAMILY HISTORY:  Cancer runs in family.  Her brother had colon cancer.  Her father passed away with an MI.  One of her sisters had brain cancer,  and the other sister also has heart problems.   HOME MEDICATIONS:  1. Coumadin 2.5 mg daily.  2. Estradiol 1 mg once a day.  3. Bisoprolol/hydrochlorothiazide  5/25 mg once a day.  4. Synthroid 75 mcg daily.  5. Crestor 20 mg daily.  6. Benicar 20 mg daily.  7. Cardizem 180 mg twice a day.  8. Vitamin D once a day.  9. Protonix 20 mg orally daily.   ALLERGIES:  None.   PHYSICAL EXAMINATION:  GENERAL:  The patient is alert, awake, oriented  x4, not in any acute distress.  VITAL SIGNS:  Temperature 97.2, pulse 48, blood pressure 102/44,  respirations 22, O2 saturation 90% on room air, 93% on 2 liters on O2  via nasal cannula.  HEENT:  Head normocephalic and atraumatic.  Pupils equal, round, and  reactive to light and accommodation.  No icterus was noted.  NECK:  Supple.  No lymphadenopathy.  CHEST:  Bilateral air entry.  Also, there are crackles bilaterally,  worse on the left side.  HEART:  Regular rate and rhythm.  No murmurs were noted.  ABDOMEN:  Soft.  Bowel sounds positive.  Nontender, nondistended.  No  guarding, rigidity.  EXTREMITIES:  1+ edema in dependent  areas.   Sodium 159, potassium 3.7, chloride 102, bicarb 28, BUN 14, creatinine  1.2, glucose 108.  Hemoglobin 12.9, hematocrit 38, white count 13.2.  CK-  MB less than 1.  Troponin less than 0.05.  INR 1.6.  Urinalysis negative  for leukocyte esterase or nitrites.   Chest x-ray:  Right mid and lower lung airspace disease, pneumonia  versus edema, positive cardiomegaly.   EKG not available at this time.   IMPRESSION:  1. Pulmonary edema with congestive heart failure decompensation,      likely diastolic dysfunction.  2. Question pneumonia.  3. History of hypertension.  4. History of chronic atrial fibrillation with bradycardia.  5. History of hypothyroidism.  6. Hypercholesterolemia.  7. History of cerebrovascular accident with residual right      hemiparesis.   PLAN:  1. Will admit the patient to the telemetry unit.  2. The patient has received Lasix 40 mg in the emergency room.  We      will repeat Lasix 40 mg IV in the morning.  3. Follow I's and O's.  4. Follow liver function closely.  5. Follow cardiac enzymes x3 and will also obtain EKG.  6. Will reduce  Cardizem to 120 mg daily for weakness and dizziness      which could be secondary to this.  7. For bradycardia, rate has to be followed daily.  8. The patient also has subtherapeutic INR.  Will give Coumadin 5 mg      tonight and follow INR daily.  Will also give Lovenox until her INR      is therapeutic.  9. Dr. Katrinka Blazing, cardiologist, was informed by the ED physician.  He      wanted [place] to consult cardiology if needed.      Estelle Grumbles, MD  Electronically Signed     TP/MEDQ  D:  09/01/2008  T:  09/01/2008  Job:  [id number]   cc:   Hansel Feinstein, M.D.  [address]  [place], [place] [address]

## 2011-03-09 NOTE — H&P (Signed)
NAMEREI, CONTEE NO.:  [id number]   MEDICAL RECORD NO.:  [id number]          PATIENT TYPE:  EMS   LOCATION:  ED                           FACILITY:  [facility]   PHYSICIAN:  Isidor Holts, M.D.  DATE OF BIRTH:  11-Sep-1931   DATE OF ADMISSION:  07/24/2008  DATE OF DISCHARGE:                              HISTORY & PHYSICAL   PRIMARY CARE PHYSICIAN:  Lucky Cowboy, M.D.   PRIMARY NEUROLOGIST:  Pramod P. Pearlean Brownie, MD.   CHIEF COMPLAINT:  Constipation for 2 weeks; abdominal pain since last  night, nausea and no vomiting.   HISTORY OF PRESENT ILLNESS:  This is a 77 year old female. For past  medical history, see below.  The patient is a very good historian, and  as a matter of fact is also accompanied by her spouse.  History is  obtained from both of them.  The patient is status post hospitalization  from July 11, 2008 to July 16, 2008, for acute left MCA  territory embolic CVA, managed successfully with thrombolysis.  According to the patient, during the course of that hospitalization, she  had no bowel movement whatsoever and has had none since discharge.  She  finally saw her primary physician on July 19, 2008, complained to  him about this, and was started on a complicated bowel regimen  consisting of multiple laxatives, to no avail.  She saw her primary  physician, Dr. Lucky Cowboy, again on July 23, 2008.  Abdominal  x-ray was done, which showed diffuse colonic distention.  She was then  referred to the emergency department at [facility],  where repeat abdominal x-ray confirmed the findings of colonic  distention/ileus.  She was referred to the medical service for  admission.   The patient denies vomiting; denies diarrhea, although she is  occasionally nauseated.  Her oral intake has been somewhat poor,  secondary to poor appetite since discharge from the hospital.  She has  no fever, no shortness of breath, no  chest pain.   PAST MEDICAL HISTORY:  1. Status post left MCA territory embolic CVA July 11, 2008;      treated with thrombolysis.  2. Paroxysmal atrial fibrillation, on chronic anticoagulation.  3. Query diastolic dysfunction.  4. Morbid obesity.  5. Dyslipidemia.  6. Hypertension.  7. Status post thyroidectomy and partial vocal cord resection for      thyroid carcinoma.  8. Hypothyroidism.  9. Knee degenerative joint disease.  10.Status post bilateral cataract surgery.  11.Remote history of intracranial hemorrhage approximately 30 years      ago.  Query secondary to intracranial aneurysm; had no surgical      intervention at that time.   MEDICATIONS:  1. Coumadin 5 mg p.o. daily.  2. Synthroid 75 mcg p.o. daily.  3. Estradiol 1 mg p.o. daily.  4. Hydrochlorothiazide 25 mg p.o. daily.  5. Oxybutynin 10 mg p.o. daily.  6. Vitamin D 2000 international units p.o. daily.  7. Crestor 20 mg p.o. daily.  8. Benicar 20 mg p.o. daily.  9. Diclofenac 75 mg p.o. b.i.d.  10.Diltiazem SR 240 mg  p.o. daily.   ALLERGIES:  NO KNOWN DRUG ALLERGIES.   REVIEW OF SYSTEMS:  As per HPI and chief complaint, otherwise negative.   SOCIAL HISTORY:  Married.  Resides in [place], [place].  She has  one daughter living in the [place] area, who is alive and well.  Nonsmoker, nondrinker; has no history of drug abuse.   FAMILY HISTORY:  Father died status post MI.  Mother died with diabetes  and congestive heart failure.  The patient has 10 siblings; one brother  died status post MI.  Two sisters have passed away, one with brain  cancer and one with heart disease.  Family history is otherwise  noncontributory.   PHYSICAL EXAMINATION:  VITALS:  Temperature 97.9, pulse 78 per minute  and regular, respiratory rate 20, BP 97/33 mmHg, pulse oximetry 97% on  room air.  The patient did not appear to be in obvious acute distress at  the time of this evaluation; alert, communicative, not short of  breath  at rest.  HEENT: No clinical pallor or jaundice.  No conjunctival injection.  Throat: Visible mucous membranes appear dry .  NECK:  Supple.  JVP not seen.  No palpable lymphadenopathy.  No palpable  goiter.  CHEST:  Clinically clear to auscultation.  No wheezes or crackles.  HEART:  Sounds 1 and 2 are heard; normal irregularly irregular.  No  murmurs.  ABDOMEN:  Morbidly obese; softly distended, nontender.  Tinkling bowel  sounds are heard.  Unable to palpate organs secondary to body habitus.  EXTREMITIES:  Lower extremity examination with no pitting edema.  Palpable peripheral pulses.  MUSCULOSKELETAL:  Remarkable only for osteoarthritic changes.  CENTRAL NERVOUS SYSTEM:  No focal neurologic deficit on gross  examination.  RECTAL:  Examination unremarkable.  No impacted stool palpable in the  rectal vault.   INVESTIGATIONS:  CBC:  WBC 13.7, hemoglobin 12.1, hematocrit 36.6,  platelets 178.  Electrolytes:  Sodium 133, potassium 5.1, chloride 93,  CO2 38, BUN 24, creatinine 1.99, glucose 122.  AST 21, ALT 18, alkaline  phosphatase 68, lipase 13, INR 2.7.   ABDOMINAL/CHEST X-RAY:  (dated July 14, 2008)  Shows cardiomegaly,  no pulmonary edema.  Dilated colonic loops and cholelithiasis.   ASSESSMENT AND PLAN:  1..  Colonic ileus versus pseudo-obstruction:  (i.e., against a background of recent cerebrovascular accident).  The  patient has no fecal impaction on rectal examination.  We shall keep her  n.p.o.  Commence intravenous fluids, intravenous Reglan and do abdominal  CT scan to elucidate pathology.  Further management will depend on  abdominal CT scan findings.   1. Recent left MCA territory embolic cerebrovascular accident:  This      was managed successfully with thrombolysis.  The patient currently      has no focal neurologic deficit.   1. Anticoagulation:  The patient is on anticoagulation for paroxysmal      atrial fibrillation, against a background of  embolic      cerebrovascular accident.  INR is therapeutic at this time, at 2.7,      however, we shall hold Coumadin and manage with bridging Heparin      for now, in case surgical intervention (although unlikely) is      required.   1. History of hypertension:  Blood pressure low normal at present.  We      shall hold all antihypertensives and diuretics, and manage with      intravenous fluids.   1. Renal insufficiency:  The patient has a high EAV:[id number] ratio.      Baseline creatinine was 1.37 at the time of discharge on July 16, 2008.  Likely the patient is dehydrated against the background      of diminished oral intake, continued diuretic therapy and ARB.  As      mentioned above, we shall hold diuretics and antihypertensive      medications, and rehydrate with normal saline.   1. Dyslipidemia:  The patient is currently on Crestor.  We shall check      fasting lipid profile.   1. Dysthyroidism:  We shall continue Synthroid, albeit parenterally      for now, and check TSH.   1. Paroxysmal atrial fibrillation:  The patient is currently in rate-      controlled atrial fibrillation.  We shall monitor telemetrically.   Further management will depend on clinical course.      Isidor Holts, M.D.  Electronically Signed     CO/MEDQ  D:  07/24/2008  T:  07/24/2008  Job:  [id number]   cc:   Lucky Cowboy, M.D.  Fax: [phone]   Pramod P. Pearlean Brownie, MD  Fax: [phone]

## 2011-03-09 NOTE — Discharge Summary (Signed)
NAMECYSTAL, Breanna              ACCOUNT NO.:  [id number]   MEDICAL RECORD NO.:  [id number]          PATIENT TYPE:  INP   LOCATION:  1406                         FACILITY:  [facility]   PHYSICIAN:  Isidor Holts, M.D.  DATE OF BIRTH:  Oct 11, 1931   DATE OF ADMISSION:  07/24/2008  DATE OF DISCHARGE:  07/27/2008                               DISCHARGE SUMMARY   PRIORITY DISCHARGE SUMMARY   PRIMARY MEDICAL DOCTOR:  Breanna Cowboy, MD.   PRIMARY NEUROLOGIST:  Pramod P. Pearlean Brownie, MD.   DISCHARGE DIAGNOSES:  1. Colonic ileus.  2. Recent left middle cerebral artery territory embolic      cerebrovascular accident, status post thrombolysis.  3. Chronic anticoagulation.  4. Paroxysmal atrial fibrillation.  5. Hypertension.  6. Diastolic dysfunction.  7. Dysthyroidism.  8. Dyslipidemia.  9. History of renal insufficiency.  10.Morbid obesity.  11.Osteoarthritis.   DISCHARGE MEDICATIONS:  1. Synthroid 75 mcg p.o. daily.  2. Estradiol 1 mg p.o. daily.  3. Vitamin D 2000 International Units daily.  4. Crestor 20 mg p.o. daily.  5. Benicar 20 mg p.o. daily.  6. Coumadin 5 mg p.o. at 6:00 p.m. daily; otherwise, per INR.  7. MiraLax 17 g p.o. daily.  8. Diclofenac 75 mg p.o. b.i.d.  It is recommended that primary MD      review this, as patient is currently on Coumadin.   Note:  Hydrochlorothiazide, Oxybutynin, and Diltiazem-SR have all been  discontinued, until reviewed by primary MD, as these medications were  likely contributory to colonic ileus.   PROCEDURES:  1. Abdominal/chest x-ray dated July 24, 2008:  This showed      cardiomegaly without pulmonary edema, dilated colonic loops      possibly related to colonic ileus from history of patient's stroke,      cholelithiasis.  2. Abdominal/pelvic CT scan dated July 24, 2008:  This showed      colonic ileus, mild wall thickening of the distal transverse colon      in the left abdomen, no evidence of abscess, cholelithiasis,  no      evidence for acute cholecystitis, no acute findings in the pelvis      other than there was sigmoid diverticulosis.  3. Barium enema dated July 25, 2008:  This showed cholelithiasis,      and prep with absorbable enema shows no obstruction or gross      lesions other than a few sigmoid diverticula.   CONSULTATIONS:  1. Almond Lint, MD, Surgeon.  2. Vida Rigger, MD, Gastroenterologist.   ADMISSION HISTORY:  As in H&P notes of July 24, 2008.  However in  brief, this is a 77 year old female, with known history of left MCA  territory embolic CVA, July 11, 2008, treated with thrombolysis,  paroxysmal atrial fibrillation on chronic anticoagulation, probable  diastolic dysfunction, morbid obesity, dyslipidemia, hypertension,  status post thyroidectomy and partial vocal cord resection for thyroid  carcinoma, hypothyroidism, knee degenerative joint disease, status post  bilateral cataract surgery, remote history of intracranial hemorrhage  approximately 30 years ago, presenting with constipation for 2 weeks,  and abdominal pain since the night  to prior to presentation.  Reportedly, she had seen her primary MD on July 19, 2008, was  placed on a complicated bowel regimen, which proved unsuccessful, and  she was therefore referred for admission on July 23, 2008, after x-  ray studies demonstrated colonic distention/ileus.   CLINICAL COURSE:  1. Colonic ileus:  For details of presentation, refer to admission      history above.  Possible culprit medications, i.e., calcium channel      antagonist, oxybutynin, and Hydrochlorothiazide, were all      discontinued.  The patient was started on intravenous Reglan.      Abdominal CT and pelvic CT scan were done, which confirmed clinical      impression of colonic ileus.  GI consultation was kindly provided      by Dr. Vida Rigger.  For details of that consultation, refer to      consultation notes of that date.  In  addition, surgical      consultation was provided by Dr. Almond Lint.  For details of her      consultation, refer to her consultation notes of that date.  As it      turned out, the patient responded to these conservative measures.      We were able to commence her on MiraLax, and after barium enema      showed no concerning lesions, diet was advanced, and for the period      from July 26, 2008, to July 27, 2008, patient made normal      bowel movements.   1. Paroxysmal atrial fibrillation:  The patient remained in sinus      rhythm during the course of this hospitalization.  Her      anticoagulation was monitored by a clinical pharmacologist, and her      INR remained therapeutic during the course of this hospitalization.   1. History of diastolic dysfunction:  There was no clinical evidence      of congestive heart failure during this hospitalization.   1. Hypertension:  The patient remained normotensive during the course      of this hospitalization.  Her Cardizem and Hydrochlorothiazide were      placed on hold, because of colonic ileus.  We have recommended that      she continue to hold these medications, until she is reevaluated by      her primary MD, which will be on July 29, 2008, per a prior      scheduled appointment.   1. Dysthyroidism:  The patient was continued on replacement therapy      with Synthroid.   1. History of dyslipidemia:  Patient continued on statin treatment.   1. Renal insufficiency:  The patient at the time of presentation, was      found to be somewhat dehydrated with a BUN of 24 and creatinine of      1.99.  She was managed with intravenous fluids, and we are pleased      to note that as of July 27, 2008, BUN was 10 with a creatinine      of 0.99.   DISPOSITION:  The patient was, on July 27, 2008, asymptomatic and  very keen to be discharged.  She was also ambulating and tolerating a  regular diet.  She was therefore, discharged  accordingly.   DIET:  Heart healthy.   ACTIVITY:  As tolerated.   FOLLOWUP INSTRUCTIONS:  The patient is to follow up with her  primary MD,  Dr. Lucky Wade, per prior scheduled appointment, i.e., on July 29, 2008.  She is also to follow up with her primary neurologist, Dr.  Delia Wade, per prior scheduled appointment.   SPECIAL INSTRUCTIONS:  The patient has been recommended to have her  PT/INR checked at her primary MD's office in the a.m. of July 29, 2008.  Her primary MD will thereafter, adjust Coumadin dosage, if  necessary.  In addition, we have recommended that her primary MD review  patient's utilization of Diclofenac, as she may have drug interaction  with Coumadin on this basis.  Perhaps, an alternative medication can be  utilized for her osteoarthritis.      Isidor Holts, M.D.  Electronically Signed     CO/MEDQ  D:  07/27/2008  T:  07/27/2008  Job:  [id number]   cc:   [name], M.D.  Fax: [phone]   Pramod P. Pearlean Brownie, MD  Fax: [phone]

## 2011-03-09 NOTE — Consult Note (Signed)
[name], Wade NO.:  [id number]   MEDICAL RECORD NO.:  [id number]          PATIENT TYPE:  INP   LOCATION:  4705                         FACILITY:  [facility]   PHYSICIAN:  Marlan Palau, M.D.  DATE OF BIRTH:  February 10, 1931   DATE OF CONSULTATION:  09/11/2008  DATE OF DISCHARGE:                                 CONSULTATION   HISTORY OF PRESENT ILLNESS:  [name] is a 77 year old, right-  handed white female born on 1931-04-13, with a history of a left  middle cerebral artery distribution stroke that occurred on July 11, 2008.  The patient came in at that point with right-sided weakness  and speech difficulty that began around 5:30 p.m. after just finishing  dinner.  The patient was given TPA at that point and did fairly well  with her deficits.  The patient has had minimal residual weakness in the  right arm and has been undergoing some physical therapy for this.  The  patient has history of atrial fibrillation, has been treated with  Coumadin and has had a recent bout of congestive heart failure requiring  admission.  The patient was just recently discharged on September 06, 2008.  The patient has returned to the hospital at this point with a  sudden onset of left arm jerking that began at 9:30 a.m. today.  This  lasted for about 45 minutes and then stopped.  The patient also reported  some numbness down the left arm pain in the left shoulder and upper  chest and arm.  The patient has not had any further problems throughout  the day today.  The patient was not jerking on the face, but could not  walk during the episode, even the leg was not jerking.  The patient  feels she is close to her baseline currently, but is reporting some  tingling in the left hand.  Neurology is asked to see the patient for  further evaluation.  A head scan has not been done.   PAST MEDICAL HISTORY:  Significant for:  1. History of left middle cerebral artery  distribution stroke.  2. New onset left arm jerking as above.  3. Congestive heart failure.  4. Atrial fibrillation.  5. Obesity.  6. Dyslipidemia.  7. Hypertension.  8. Hypothyroidism, status post thyroidectomy due to thyroid cancer.  9. Degenerative arthritis.  10.Cataract surgery.  11.Intracranial hemorrhage 30 years ago.   The patient has no known allergies, not smoke or drink.   CURRENT MEDICATIONS:  1. Aspirin 325 mg daily.  2. Ziac 1 tablet daily.  3. Diltiazem 180 mg b.i.d.  4. Estradiol 1 mg daily.  5. Lasix 40 mg daily.  6. Imdur 30 mg daily.  7. Synthroid 0.075 mg daily.  8. Benicar 20 mg daily.  9. Protonix 40 mg daily.  10.MiraLax 17 g daily.  11.Crestor 40 mg daily.  12.The patient is on Lovenox currently.  13.Sublingual nitroglycerin if needed.   SOCIAL HISTORY:  This patient is married, lives in the [place], [place] area, has one daughter living in [place].  The patient is  retired.   FAMILY MEDICAL HISTORY:  Father died with an MI.  Mother died with  diabetes and congestive heart failure.  The patient has 10 siblings, one  brother died with an MI, 2 sisters have passed away, one with brain  cancer and one with heart disease.   REVIEW OF SYSTEMS:  Notable for no recent fevers or chills.  The patient  has had some problem with cold recently.  Has had no problems with  headache per se, but did have some mild slurred speech today with a  jerking, some blurred vision, and nausea with this.  Has had some  shortness of breath off and on and did had some chest pain.  The patient  denies any problems controlling bowels, bladder.  Denies any blackout  episodes.   PHYSICAL EXAMINATION:  VITAL SIGNS:  Blood pressure is 110/63, heart  rate is 57, respiratory rate 20, and temperature afebrile.  GENERAL:  This patient is a moderately obese white female, who is alert,  cooperative at the time of examination.  HEENT:  Head is atraumatic.  Eyes, pupils are  round and reactive to  light.  Disks are soft, flat bilaterally.  NECK:  Supple.  No carotid bruits noted.  RESPIRATORY:  Clear.  CARDIOVASCULAR:  Irregular rhythm with no obvious murmurs or rubs noted.  EXTREMITIES:  Without significant edema.  NEUROLOGIC:  Cranial nerves as above.  Facial symmetry is present.  The  patient has good sensation of face to pinprick, soft touch bilaterally.  Has good strength in facial muscles, head turn, and shoulder shrug  bilaterally.  Speech is well enunciated, not aphasic.  Again,  extraocular movements are full.  Visual fields are full.  Motor testing  shows good strength in all fours.  Good symmetric motor is noted  throughout.  Sensory testing is intact pinprick, soft touch, vibratory  sensation throughout.  The patient has good finger-to-nose, heel-to-  shin.  Gait was not tested.  No drift is seen in the arms or legs.  The  patient has a symmetric reflexes.  Toes are downgoing bilaterally.   Laboratory values notable for white count of 8.3, hemoglobin of 11.9,  hematocrit of 36.3, MCV of 87.1, and platelets of 518.  INR 2.1.  Sodium  133, potassium 4.5, chloride of 95, CO2 31, glucose 129, BUN of 11,  creatinine 1.39, and calcium 8.9.  CK of 13, MB fraction 0.6, troponin-I  0.01.   IMPRESSION:  1. New onset of left arm jerking.  Rule out focal seizure event.  2. History of cerebrovascular disease with left middle cerebral artery      distribution stroke.  3. Atrial fibrillation.   This patient is admitted with what sounds like a focal seizure event.  We will pursue further evaluation.  Rule out an acute right brain event  at this point.   PLAN:  1. MRI of the brain.  2. MR angiogram of intracranial vessels.  3. EEG study.  4. Keppra therapy for now.  We will follow the patient's clinical      course while in house.      Marlan Palau, M.D.  Electronically Signed     CKW/MEDQ  D:  09/11/2008  T:  09/12/2008  Job:  [id number]    cc:   Lyn Records, M.D.  Lucky Cowboy, M.D.

## 2011-03-09 NOTE — Discharge Summary (Signed)
[name], [name]              ACCOUNT NO.:  [id number]   MEDICAL RECORD NO.:  [id number]          PATIENT TYPE:  INP   LOCATION:  1420                         FACILITY:  [facility]   PHYSICIAN:  Peggye Pitt, M.D. DATE OF BIRTH:  Mar 31, 1931   DATE OF ADMISSION:  09/01/2008  DATE OF DISCHARGE:  09/06/2008                               DISCHARGE SUMMARY   DISCHARGE DIAGNOSES:  1. Dyspnea likely secondary to a combination of pneumonia and      congestive heart failure.  2. Pneumonia.  3. Diastolic congestive heart failure.  4. Leukocytosis.  5. Acute renal failure, resolved.  6. Hyperlipidemia.  7. Chronic atrial fibrillation.  8. Chest pressure.   DISCHARGE MEDICATIONS:  1. Cardizem 240 mg p.o. daily.  2. Lasix 40 mg p.o. daily.  3. Isosorbide mononitrate 30 mg p.o. daily.  4. Ziac 5/6.25 mg p.o. daily.  5. Avelox 400 mg p.o. daily for 6 more days.  6. Coumadin 2.5 mg p.o. daily.  7. MiraLAX 17 grams p.o. daily.  8. Synthroid 75 mcg p.o. daily.  9. Estradiol 1 mg p.o. daily.  10.Benicar 20 mg p.o. daily.  11.Vitamin D 1000 international units daily.  12.Crestor 20 mg p.o. nightly.   DISPOSITION AND FOLLOWUP:  Patient is discharged home in stable  condition.  She will follow up with Dr. Verdis Prime on November 30 at  1:00 p.m.   CONSULTATIONS SINCE HOSPITALIZATION:  Dr. Verdis Prime with Cardiology.   IMAGES PERFORMED THIS HOSPITALIZATION:  A chest x-ray on September 01, 2008 that showed a right mid and lower lung airspace opacity, could  represent pneumonia or alveolar edema and stable cardiomegaly.  Repeat  chest x-ray on September 05, 2008 that showed marked improvement in right  basilar airspace disease.  She also had a Cardiolite that showed a  suspected scar at the cardiac apex but no inducible ischemia.   HISTORY AND PHYSICAL EXAM:  For full details please refer to history and  physical dictated by Dr. Francene Boyers on September 01, 2008 but in brief  [name]  is a 77 year old obese Caucasian woman with history of CHF  secondary to diastolic dysfunction and atrial fibrillation who presented  to the emergency department with weight gain and shortness of breath.  For that reason we were called for admission.   ASSESSMENT AND PLAN:  1. For a possible pneumonia she was placed on Avelox upon admission,      upon discharge she has 6 more days pending of this.  2. For her pulmonary edema and congestive heart failure with diastolic      dysfunction she was placed on strict ins and outs, daily weights.      She had good diuresis while in the hospital on Lasix, she will be      discharged on 40 mg daily of furosemide.  3. For her chest pain, Dr Katrinka Blazing was consulted who performed a      Cardiolite which showed no evidence or inducible ischemia.  He has      altered her heart regimen and will follow up with him in the  outpatient setting.  4. For her chronic atrial fibrillation her INR was therapeutic while      in the hospital, her regular Coumadin 2.5 mg dose.  She is also      rate controlled under Cardizem.  5. For her hypokalemia and hypomagnesemia both of these were repleted      to adequate levels.  6. Discharge vital signs:  Blood pressure 123/62, heart rate 74,      respirations 18, O2 sats 98% on room air with a temperature of      97.8.  Labs on day of discharge:  Sodium 132, potassium 4.4,      chloride 96, bicarb 28, BUN 19, creatinine 1.24, and a glucose of      114, a BNP of 200 and an INR of 2.6.      Peggye Pitt, M.D.  Electronically Signed     EH/MEDQ  D:  09/06/2008  T:  09/06/2008  Job:  [id number]   cc:   Lyn Records, M.D.  Fax: [phone]

## 2011-03-09 NOTE — H&P (Signed)
NAMEABAGAIL, LIMB NO.:  [id number]   MEDICAL RECORD NO.:  [id number]          PATIENT TYPE:  INP   LOCATION:  2301                         FACILITY:  [facility]   PHYSICIAN:  Marlan Palau, M.D.  DATE OF BIRTH:  1931-07-10   DATE OF ADMISSION:  07/11/2008  DATE OF DISCHARGE:                              HISTORY & PHYSICAL   HISTORY OF PRESENT ILLNESS:  [name] is a 77 year old right-  handed white female born on Mar 07, 1931, with a history of  hypertension, obesity, and dyslipidemia followed by Dr. Lucky Cowboy.  This patient presented to the [facility] Emergency Room today after her  husband noted onset of problems with right-sided deficits, speech  difficulty at 5:30 p.m.  They had just finished the dinner, the patient  had taken her coffee into the living room and the patient's husband was  finishing the dishes.  When he went into the room moments later the  patient was on the couch, eyes closed.  The patient was trembling with  the left arm, but unable to speak.  EMS was called.  The patient was  brought to the emergency room and was found to have an aphasia, right  visual spatial apraxia with right upper extremity, and right hemisensory  deficit.  CT scan of the brain has been done, shows evidence of  bihemispheric.  Low-density areas appear to be consistent with small  vessel ischemic infarcts in the past.  No acute changes were seen.  No  hemorrhage was seen.  NIH stroke scale score was 5.  The patient was  felt to be an adequate candidate for tPA.   PAST MEDICAL HISTORY:  1. New onset of aphasia, visual spatial apraxia, and right hemisensory      deficit.  2. History of hypertension.  3. Obesity.  4. Dyslipidemia.  5. History of hypothyroidism status post thyroidectomy due to thyroid      carcinoma and partial vocal cord resection as well with this.  6. History of degenerative arthritis of the knees.  7. Cataract surgery bilaterally.  8. History of intracranial hemorrhage 30 years ago, questionable      source of hemorrhage, the patient never required surgery.   The patient has no known allergies.  Does not smoke or drink.   CURRENT MEDICATIONS:  1. Aspirin 81 mg daily.  2. Synthroid 0.075 mg daily.  3. Estradiol 1 mg daily.  4. Hydrochlorothiazide 25 mg daily.  5. Oxybutynin 10 mg daily.  6. Vitamin D supplementation 2000 IU daily.  7. Crestor 20 mg daily.  8. Benicar 20 mg daily.  9. Diclofenac 75 mg twice daily.  10.Diltiazem 240 mg SR tablet daily.   SOCIAL HISTORY:  This patient is married, lives in [place], [place].  She has 1 daughter living in the [place] Area who is  alive and well.  This patient is retired.   FAMILY MEDICAL HISTORY:  Notable that father died with an MI.  Mother  died with diabetes and congestive heart failure.  The patient has 10  siblings, 1 brother died with  an MI, 2 sisters have passed away, 1 with  brain cancer and 1 with heart disease.   REVIEW OF SYSTEMS:  Notable for no headache, shortness of breath, or  abdominal pain.  The patient denies dizziness, nausea, or vomiting.   PHYSICAL EXAMINATION:  VITAL SIGNS:  Blood pressure is 141/53, heart  rate 56, and temperature afebrile.  GENERAL:  This patient is a moderately obese white female who is alert  and cooperative at the time of examination.  HEENT:  Head is atraumatic.  Eyes, pupils are postsurgical, round, and  reactive to light.  Disks are flat bilaterally.  NECK:  Supple.  No carotid bruits noted.  RESPIRATORY:  Clear.  CARDIOVASCULAR:  Regular rate and rhythm.  No obvious murmurs or rubs  noted.  ABDOMEN:  An obese abdomen.  Positive bowel sounds.  No organomegaly or  tenderness noted.  EXTREMITIES:  Notable for trace edema at the ankles bilaterally.  NEUROLOGIC:  Cranial nerves as above.  Facial symmetry is present.  The  patient has decreased pinprick sensation on the right face compared to  the left.   Has full extraocular movements.  Visual fields are full.  Speech is well enunciated, but clearly aphasic.  Motor testing reveals  some minimal decrease in grip strength on the right hand, otherwise has  excellent strength on both upper and both lower extremities.  No drift  is seen in the arms or the legs.  The patient has decreased pinprick  sensation on the right arm and right leg as compared to the left with  decreased vibratory sensation in the right arm and right leg as compared  to the left.  The patient has no obvious ataxia, but has significant  visual spatial apraxia with the use of the right upper extremity, better  with the left.  The patient is able to perform heel-to-shin poorly with  the legs, but no obvious ataxia is seen.  Deep tendon reflexes are  symmetric.  Toes are neutral bilaterally.  The patient was not  ambulating.   CT of the head is as above.  Blood work includes white count of 13.1,  hemoglobin of 13.8, hematocrit of 41.1, MCV of 88.1, and platelets of  295.   IMPRESSION:  1. New-onset of cerebrovascular event referable to the left deep      parietal white matter.  2. Small vessel disease by CT.  3. Hypertension.  4. Dyslipidemia.   This patient has some risk factors for stroke.  The patient at this  point has significant aphasia syndrome, but no significant motor  weakness.  Does have a right hemisensory deficit due to neglecting on  the right side to the double simultaneous stimulation.  The patient is  felt to be a TPA candidate.  At this point, we will initiate full-dose  IV tPA.   PLAN:  1. IV tPA at this time.  2. ICU admission.  3. MRA of the brain.  4. MRI angiogram of the intracranial and extracranial vessels.  5. A 2-D echocardiogram.  6. EEG study given the history of tremor on the left arm and onset of      the above deficit.   Physical and occupational therapy will be obtained.  We will follow  patient's clinical course while  in-house.  The patient apparently  desires a DNR status and this will be initiated.      Marlan Palau, M.D.  Electronically Signed     CKW/MEDQ  D:  07/11/2008  T:  07/12/2008  Job:  [id number]   cc:   Lucky Cowboy, M.D.  [facility]

## 2011-03-09 NOTE — Consult Note (Signed)
[name], [name]              ACCOUNT NO.:  [id number]   MEDICAL RECORD NO.:  [id number]          PATIENT TYPE:  INP   LOCATION:  1420                         [facility]ACILITY[facility]  [facility]   PHYSICIAN:  Lyn Records, M.D.   DATE OF BIRTH:  [birth year]   DATE OF CONSULTATION:  09/04/2008  DATE OF DISCHARGE:                                 CONSULTATION   REASON FOR CONSULTATION:  Dyspnea.   CONCLUSIONS:  1. Dyspnea likely secondary to CHF from diastolic heart failure (LVEF      greater than 60% by echo 06/2008).  The etiology of the diastolic      heart failure could be related to obesity, hypertension, for      superimposed ischemia.  The INR was subtherapeutic on admission,      but I doubt pulmonary embolism as the patient has improved somewhat      with diuresis.  2. Chronic atrial fibrillation.  3. Recurring chest pressure and diaphoresis occurred on the day of      admission preceding admission and has recurred since being      hospitalized.  This could represent coronary artery disease and      ischemia.  4. Recent embolic CVA.  5. Hyperlipidemia.   PLAN:  1. Had isosorbide mononitrate 30 mg per day and titrate dose depending      upon her blood pressure response.  2. Aspirin 81 mg per day.  3. Continue diuresis and follow renal function.  4. Sublingual nitroglycerin as needed for chest pain.  5. Pharmacologic nuclear study to rule out high-risk coronary disease      that may require revascularization.  6. Strict I's and O's and daily weights.  7. Daily B-MET and BNP   COMMENT:  The patient is 77 year old and in September had a CVA.  It was  felt that CVA was likely embolic related to atrial fibrillation.  She  had cardiac evaluation at that time and has normal LV function with EF  greater than 60%.  She was discharged from the hospital on Coumadin.  Upon admission, Coumadin therapy continued, but she was subtherapeutic  with reference to INR, which was 1.6.  On the day of  admission, the  family states that she became suddenly short of breath.  She was  nauseated and had diaphoresis and also comitted that there was tightness  in her chest.  In the hospital, she has received some diuresis, but is  difficult to tell how much as I's and O's and weights have not been  accurately followed.  Last evening before supper, she again developed  chest tightness and diaphoresis along with shortness of breath.  The  family feels that this may have been because oxygen therapy has been  stopped.  This was failed and was not able to help very much from a  historical standpoint.  The family states that perhaps she gained some  weight over the weekend to prior to admission.   MEDICATIONS:  Her medications on admission included:  1. Diltiazem 180 mg twice a day (a recent changed from 240 mg once  per      day).  2. Vitamin D.  3. Protonix 20 mg per day.  4. Benicar 20 mg per day.  5. Crestor 20 mg per day.  6. Synthroid 75 mcg per day.  7. Bisoprolol HCTZ 5/25 mg per day.  8. Estradiol 1 mg per day.  9. Coumadin 0.5 mg per day.   ALLERGIES:  None.   PHYSICAL EXAMINATION:  GENERAL:  The patient is in no acute distress.  Her blood pressure is 130/70, heart rate is of 70 and irregularly  irregular.  NECK:  There is no JVD.  LUNGS:  Decreased breath sounds are heard at the bases.  No wheezes or  rales are heard.  CARDIAC:  Unremarkable.  No gallop or rub is heard.  ABDOMEN:  Soft.  EXTREMITIES:  Reveal no edema.   The EKG done on September 01, 2008, reveals atrial fibrillation with a  slow ventricular response.  No acute ST-T wave change or ischemic  abnormality is noticed.   Chest x-ray on admission revealed possibility of airspace disease from  edema versus infiltrate.  BNP on admission was 538, potassium 3.7, BUN  14, creatinine 1.2.  TSH 5.526.  Initial CK-MB was normal, but increased  slightly on the second to determinations to 10.2 and 6.5.  Troponins  have all  been negative.  Hemoglobin 11.9   DISCUSSION:  The patient presents with sudden onset of dyspnea and  probable pulmonary edema.  She seems to have improved with diuresis,  although it was difficult to quantitate the magnitude of her diuresis  because of inadequate charting.  With normal LV function by previous  echo less than 2 months ago, heart failure will be related to diastolic  dysfunction.  This could be related to hypertension, aggravated by  atrial fibrillation, but also superimposed ischemia could be a major  component especially given her history of chest pressure.  We should  continue to diurese the patient watching her renal function.  I will  start a long-acting nitrates.  We will have a myocardial perfusion study  performed.  We should use nitroglycerin sublingually for chest  discomfort.  I will continue to follow.      Lyn Records, M.D.  Electronically Signed     HWS/MEDQ  D:  09/04/2008  T:  09/05/2008  Job:  [id number]   cc:   Kimberlee Nearing Durwin Glaze

## 2011-03-09 NOTE — Procedures (Signed)
CLINICAL HISTORY:  A 77 year old female was admitted for unstable angina  with onset of left arm jerking, numbness, and pain in the left arm,  upper chest, and had history of middle cerebral artery stroke in  September 2009, received TPA, history of atrial fibrillation, congestive  heart failure, and intracranial hemorrhage 30 years ago.   TECHNICAL COMMENTS:  An 18-channel EEG was performed based on standard  International 10-20 system.  Total recording time is 21.6 minutes, 17th  channel dedicated to EKG demonstrated irregular heart rate of 60 beats  per minute.   Upon awakening, the posterior background activity was well developed,  symmetric, 9 Hz, reactive to eye opening and closure. There was no  epileptiform discharge recorded.  Photic stimulation with flash  frequency of 1-19 Hz was performed.  There was no abnormality elicited.  The patient was able to achieve stage II sleep during the recording, as  evident by vertex waves and sleep spindle, but there was no abnormality  elicited.   CONCLUSION:  This is a normal awake and sleep EEG.  There is no evidence  of epileptiform discharge.      Levert Feinstein, MD  Electronically Signed     [id number]  D:  09/13/2008 16:36:19  T:  09/14/2008 04:08:47  Job #:  [id number]

## 2011-03-09 NOTE — Consult Note (Signed)
[name], [name]              ACCOUNT NO.:  [id number]   MEDICAL RECORD NO.:  [id number]          PATIENT TYPE:  INP   LOCATION:  1406                         FACILITY:  [facility]   PHYSICIAN:  Almond Lint, MD       DATE OF BIRTH:  1931/07/07   DATE OF CONSULTATION:  07/24/2008  DATE OF DISCHARGE:                                 CONSULTATION   REQUESTING PHYSICIAN:  Dr. Orlene Och.   CHIEF COMPLAINT:  Abdominal distention.   HISTORY OF PRESENT ILLNESS:  [name] is a 77 year old female who  came to the emergency department after not having had a bowel movement  for 2 weeks.  She was admitted a couple of weeks ago for a  cerebrovascular accident.  Her current complaints come from the fact  that her belly has had increasing distention.  She reports decreased  appetite but no weight loss.  No fevers or chills.  No bloody bowel  movements.  She was seen by her primary care physician yesterday for x-  rays.  They were read as colonic ileus versus colonic obstruction.  She  has tried multiple laxatives and mineral oil.  She states primarily that  it is not so much pain that she has been complaining of until the last  24 hours.  The abdominal pain in diffuse.  She does not report any  relieving symptoms.   REVIEW OF SYSTEMS:  Negative other than the HPI.   PAST MEDICAL HISTORY:  1. Hypertension.  2. History of aneurysm.  3. Hyperlipidemia.  4. Hypothyroidism.  5. Also recently MCA infarct.  6. CVA.  7. Paroxysmal atrial fibrillation.  8. Diastolic dysfunction.  9. Morbid obesity.  10.Hyperlipidemia.   SURGICAL HISTORY:  1. Status post thyroidectomy with vocal cord dysfunction.  2. Bilateral cataract surgery.   FAMILY HISTORY:  Significant for a brother with colon cancer.   MEDICATIONS ON ADMISSION:  1. Coumadin 5 mg once a day for atrial fibrillation.  2. Synthroid 75 mg once a day.  3. Estradiol 1 mg once a day.  4. Hydrochlorothiazide 25 mg once a day.  5.  Oxybutynin 10 mg once a day.  6. Vitamin D 2000 international units once a day.  7. Crestor 20 mg once a day.  8. Benicar 20 mg once a day.  9. Diclofenac 75 mg twice a day.  10.Diltiazem SR 240 mg once a day.   ALLERGIES:  None.   SOCIAL HISTORY:  She does not use alcohol or drugs and she lives in  [place].   PHYSICAL EXAMINATION:  VITAL SIGNS:  Temperature is 98.4.  Pulse 77.  Blood pressure 120/56.  Respiratory rate 16, 94% on room air.  She is sleeping but arousable easily.  HEENT:  Normocephalic, atraumatic.  I do not detect any facial  asymmetry.  Oropharynx is clear.  NECK:  Supple with no lymphadenopathy.  HEART:  Is irregular.  LUNGS:  Clear.  ABDOMEN:  Soft and distended but nontender.  EXTREMITIES:  Normal, well-perfused with +1 pitting edema.   LABORATORIES:  White count is 13.7 with a left  shift.  INR is 2.7.  Electrolytes:  Chloride is 93, creatinine 7.99 up from a baseline of  about 1.3-1.4.  LFTs are essentially within normal limits.  UA is  positive for small amount of bilirubin, otherwise negative.  Imaging  studies:  CT scan is performed which demonstrates colonic distention and  some potential thickening at the transverse colon.  X-rays demonstrate  diffuse colonic distention.   ASSESSMENT:  [name] is a 77 year old female with questionable  ileus versus colonic obstruction.  According to gastrointestinal, they  are going to obtain a barium enema tomorrow to try to better evaluate  the left colon for a mass.  We do not see one but we did not hunt for  one.  I agree with the thoughts to get a barium enema tomorrow.  Ultimately she will need a colonoscopy.  I would recommend putting her  on antibiotics and keeping her n.p.o.  I also recommend reevaluating her  frequently to make sure that there is no underlying sigmoid volvulus.      Almond Lint, MD  Electronically Signed     FB/MEDQ  D:  07/25/2008  T:  07/25/2008  Job:  [id number]

## 2011-03-09 NOTE — Cardiovascular Report (Signed)
NAMEMarland Kitchen  Breanna, Wade NO.:  [id number]   MEDICAL RECORD NO.:  [id number]           PATIENT TYPE:   LOCATION:                                 FACILITY:   PHYSICIAN:  Lyn Records, M.D.   DATE OF BIRTH:  04/30/1931   DATE OF PROCEDURE:  09/17/2008  DATE OF DISCHARGE:                            CARDIAC CATHETERIZATION   INDICATION FOR STUDY:  The patient had a recent Cardiolite study that  demonstrated a fixed apical defect of uncertain significance.  She has  had recurrent episodes of chest pain.  She has had a recent stroke with  hemorrhagic conversion after TPA.  This study is being done to define  coronary anatomy and help guide therapy.   PROCEDURES PERFORMED:  1. Left heart cath.  2. Selective coronary angiography.  3. Left ventriculography.   DESCRIPTION:  After informed consent, the patient was given 1 mg of IV  Versed and 50 mcg of fentanyl.  This put her to sleep.  Oxygenation was  well maintained.  Xylocaine 1% was used for local anesthesia followed by  a 6-French sheath placed using the modified Seldinger technique.   A 6-French A2 multipurpose catheter was then used for hemodynamic  recordings, left ventriculography, and coronary angiography.  The  patient tolerated the procedure without complications.   RESULTS:  1. Hemodynamic data:      a.     Aortic pressure 120/55.      b.     Left ventricular pressure 130/15 mmHg.  2. Left ventriculography:  Left ventricular cavity size is normal.  EF      is normal at 65%.  No regional wall motion abnormality.  3. Coronary angiography.      a.     Left main coronary:  Widely patent, heavily calcified.      b.     Left anterior descending coronary:  LAD wraps around the       left ventricular apex.  Gives origin to a large first diagonal.       The LAD is calcified.  There is 50% obstruction after the first       diagonal branch.  No significant obstructions are noted.      c.     Circumflex artery:   The circumflex essentially has 3 obtuse       marginal branches two of which, off a branching.  First obtuse       marginal, the circumflex beyond the first obtuse marginal contains       eccentric calcified 30% narrowing.  No significant obstruction is       noted.      d.     right coronary:  The right coronary artery is heavily       calcified, 50% proximal stenosis is noted, 50% mid stenosis is       noted.  Both the spots are eccentric.  No significant obstruction       is seen.   CONCLUSION:  1. Calcification noted in the left coronary and right coronary.  2. 50% proximal LAD stenosis, 50%  proximal and mid RCA stenoses.  No      high-grade lesions are noted in any of      the coronaries.  3. Normal left ventricular function.   PLAN:  Aggressive risk factor modification.  No intervention for  ischemic heart disease is indicated.      Lyn Records, M.D.  Electronically Signed     HWS/MEDQ  D:  09/17/2008  T:  09/17/2008  Job:  [id number]   cc:   Lucky Cowboy, M.D.

## 2011-03-12 NOTE — Op Note (Signed)
NAME:  [name], [name]                        ACCOUNT NO.:  [id number]   MEDICAL RECORD NO.:  [id number]                   PATIENT TYPE:  AMB   LOCATION:  ENDO                                 FACILITY:  [facility]   PHYSICIAN:  Petra Kuba, M.D.                 DATE OF BIRTH:  Jan 16, 1931   DATE OF PROCEDURE:  03/05/2003  DATE OF DISCHARGE:                                 OPERATIVE REPORT   PROCEDURE:  Colonoscopy with polypectomy.   INDICATIONS FOR PROCEDURE:  A patient with a history of colon polyps due for  repeat screening.   Consent was signed after risks, benefits, methods, and options were  thoroughly discussed in the past.   MEDICINES USED:  Demerol 90, Versed 8.   DESCRIPTION OF PROCEDURE:  Rectal inspection was pertinent for external  hemorrhoids. Digital exam was negative. The regular video colonoscope was  inserted, easily advanced to the level of the splenic flexure. Unfortunately  at that junction, the scope began to loop and despite rolling her on her  back and rolling her on her right side, we could not advance any further, so  we elected to withdraw. No obvious significant abnormality was seen on  withdrawal. We went ahead and inserted the pediatric video adjustable  colonoscope and again with some difficulty and looping, we were however able  to advance around the colon to the cecum. This did require rolling her on  her back, right side and then back on her back and then finally back on her  left side. On insertion at the approximate level of the hepatic flexure, a  tiny to small polyp was seen and was cold biopsied x1. On insertion, no  other abnormalities were seen but some left sided diverticula. The cecum was  identified by the appendiceal orifice and the ileocecal valve. Half of the  cecum could not be seen due to stool which could not be seen due to stool  which could not be washed or suctioned. The other half was normal. The scope  was slowly withdrawn.  The rest of the prep was adequate with some liquid  stool requiring washing and suctioning. On the top of the ileocecal valve,  two tiny polyps were seen and were both hot biopsied and put in a second  container. All other polyps were put in the first container with the one  from the hepatic flexure which was cold biopsied. An ascending, two other  transverse and a descending, all tiny to small polyps were seen and were hot  biopsied and put in that container. The polyp that was cold biopsied site  was found and no obvious residual polyp was seen. On slow withdrawal through  the sigmoid, there were some diverticula and wall edema, some spasm and  tortuosity but no other abnormalities. Once back in the rectum, the scope  was retroflexed pertinent for some internal hemorrhoids. The scope  was  straightened and readvanced a short ways up the left side of the colon, air  was suctioned, scope removed. The patient tolerated the procedure well.  There was no obvious or immediate complication.   ENDOSCOPIC DIAGNOSIS:  1. Internal and external hemorrhoids.  2. Left sided tic spasm and edema in the sigmoid.  3. A few tiny rectal and sigmoid hyperplastic appearing polyps not biopsied     not mentioned above.  4. Ileocecal valve with two tiny polyps hot biopsied put in container #2.  5. Ascending transverse and descending tiny polyps cold or hot biopsied put     in the first container.  6. Otherwise within normal limits to the cecum with half being seen and half     filled with stool.    PLAN:  Await pathology to determine future screening. Happy to see back  p.r.n., otherwise, return care to Dr. Oneta Rack for the customary health care  maintenance to include yearly rectals and guaiacs.                                               Petra Kuba, M.D.    MEM/MEDQ  D:  03/05/2003  T:  03/05/2003  Job:  [id number]   cc:   Lucky Cowboy, M.D.  [address]  [place], [place]  [address]  Fax: [phone]

## 2011-03-12 NOTE — Procedures (Signed)
[facility]  Patient:    Breanna, Wade Visit Number: [id number] MRN: [id number]          Service Type: END Location: ENDO Attending Physician:  Nelda Marseille Dictated by:   Petra Kuba, M.D. Proc. Date: 12/13/01 Admit Date:  12/13/2001 Discharge Date: 12/13/2001   CC:         Marinus Maw, M.D.   Procedure Report  PROCEDURE:  Colonoscopy with multiple polypectomies.  INDICATION:  Family history of colon cancer, well overdue for colonic screening.  Consent was signed after risks, benefits, methods, and options thoroughly discussed in the office.  MEDICATIONS:  Demerol 90 mg, Versed 9 mg.  DESCRIPTION OF PROCEDURE:  Rectal inspection was pertinent for external hemorrhoids, small.  Digital exam was negative.  The video colonoscope was inserted and with moderate difficulty due to a long, looping colon was able to be advanced to the cecum.  This did require multiple abdominal pressures, rolling her on her back, and then on her right side.  On insertion in the sigmoid a small to medium-sized polyp was seen and was cold biopsied x1 and put in the first container.  Other small to medium-size polyps were seen on insertion but based on the difficulty of doing the colonoscopy as well as the moderate numbers, we went ahead and removed them on withdrawal.  The cecum was identified by the appendiceal orifice and the ileocecal valve.  The cecum was normal.  The scope was slowly withdrawn.  The prep was fairly adequate, did require some washing and suctioning.  With slow withdrawal through the colon, there were small to medium-size polyps requiring either snare or hot biopsies throughout the entire colon.  One moderate-size polyp on a stalk was snared, removed in the proximal of the hepatic flexure but did require breaking into multiple pieces with the snare to suction through the scope and collect in the trap.  Other small polyps were  either snared or hot biopsied, and all others were able to be suctioned through the scope and collected in the trap.  There were no signs of bleeding from any of the polyps except for one where we cut through and then without doing any electrocautery accidentally, and then were able to hot biopsy the base.  The right-sided polyps all the way back to the midsigmoid polyp that was seen on insertion and cold biopsied were all put in the second container.  Once we withdrew back to the polyp that was cold biopsied, which was approximately in the sigmoid-descending junction, it was snared, electrocautery applied, and was removed and put in the first container.  Other sigmoid and rectal polyps that were seen and removed were put in the first container as well.  In the midsigmoid was a slightly abnormal-shaped and appearing medium-sized sessile polyp, which was snared, electrocautery applied, the polyp was removed, suctioned through the scope, and collected in the trap.  There was no obvious residual polyp seen.  This polyp was put in the third container.  All the other left-sided polyps were put in the first container.  In the rectum the scope was retroflexed, pertinent for some internal hemorrhoids and a distal small polyp, which was snared on straight visualization, removed in the customary fashion after cautery, and put in the first container.  There were a few hyperplastic-appearing rectal polyps that were not biopsied.  The scope was reinserted a short way up the sigmoid, air was suctioned, the scope removed. The  patient tolerated the procedure well.  There was no obvious immediate complication.  ENDOSCOPIC DIAGNOSES: 1. Internal-external hemorrhoids. 2. Not mentioned above, rare right-sided diverticula seen. 3. Multiple polyps in all areas except for the cecum, with multiple snares    and hot biopsies, with the sigmoid, descending polyps back to the rectum    put in the first container  and all others proximal to that put in the    second container. 4. One sigmoid semisessile polyp snared, put in bottle 3. 5. Few tiny hyperplastic-appearing rectal polyps, not biopsied. 6. Otherwise within normal limits to the cecum except for a tortuous, looping    colon.  PLAN:  Await pathology, but probably recheck colon screening in one year. Two-week customary postpolypectomy orders.  Happy to see back sooner p.r.n. Dictated by:   Petra Kuba, M.D. Attending Physician:  Nelda Marseille DD:  12/13/01 TD:  12/14/01 Job: [id number]

## 2011-05-26 LAB — HM COLONOSCOPY

## 2011-06-04 ENCOUNTER — Other Ambulatory Visit: Payer: Self-pay | Admitting: Gastroenterology

## 2011-07-26 LAB — CK TOTAL AND CKMB (NOT AT ARMC)
CK, MB: 1.4
Relative Index: INVALID
Total CK: 28

## 2011-07-26 LAB — CBC
HCT: 38.5
HCT: 39.4
Hemoglobin: 12.8
Hemoglobin: 12.8
Hemoglobin: 13.8
MCHC: 33
MCHC: 33.3
MCHC: 33.3
MCHC: 33.6
MCV: 88.4
MCV: 89
Platelets: 318
RBC: 4.32
RBC: 4.33
RBC: 4.43
RBC: 4.66
RDW: 14.6
RDW: 14.3
WBC: 11.5 — ABNORMAL HIGH
WBC: 13.1 — ABNORMAL HIGH
WBC: 17.1 — ABNORMAL HIGH

## 2011-07-26 LAB — LIPID PANEL
HDL: 57
Total CHOL/HDL Ratio: 2.8
Triglycerides: 119
VLDL: 24

## 2011-07-26 LAB — URINE MICROSCOPIC-ADD ON

## 2011-07-26 LAB — PROTIME-INR
INR: 1
Prothrombin Time: 30.8 — ABNORMAL HIGH
Prothrombin Time: 20.7 — ABNORMAL HIGH

## 2011-07-26 LAB — COMPREHENSIVE METABOLIC PANEL
ALT: 18
ALT: 16
AST: 21
AST: 21
Albumin: 3.8
Alkaline Phosphatase: 58
BUN: 25 — ABNORMAL HIGH
CO2: 30
CO2: 31
Calcium: 8.5
Calcium: 9.6
Calcium: 9.9
Chloride: 101
Creatinine, Ser: 1.99 — ABNORMAL HIGH
Creatinine, Ser: 1.61 — ABNORMAL HIGH
GFR calc Af Amer: 30 — ABNORMAL LOW
GFR calc Af Amer: 41 — ABNORMAL LOW
GFR calc non Af Amer: 24 — ABNORMAL LOW
GFR calc non Af Amer: 31 — ABNORMAL LOW
Glucose, Bld: 122 — ABNORMAL HIGH
Potassium: 3 — ABNORMAL LOW
Sodium: 133 — ABNORMAL LOW
Sodium: 138
Total Bilirubin: 0.6
Total Protein: 6.2
Total Protein: 6.9

## 2011-07-26 LAB — BASIC METABOLIC PANEL
Calcium: 9.1
Creatinine, Ser: 1.37 — ABNORMAL HIGH
GFR calc Af Amer: 46 — ABNORMAL LOW
GFR calc non Af Amer: 38 — ABNORMAL LOW
Sodium: 138

## 2011-07-26 LAB — DIFFERENTIAL
Basophils Absolute: 0.1
Basophils Relative: 0
Eosinophils Absolute: 0.1
Eosinophils Absolute: 0.1
Eosinophils Relative: 4
Lymphocytes Relative: 15
Lymphocytes Relative: 18
Lymphs Abs: 2
Lymphs Abs: 2.3
Monocytes Absolute: 0.5
Monocytes Absolute: 0.7
Monocytes Relative: 10
Monocytes Relative: 6
Monocytes Relative: 6
Neutro Abs: 6.3
Neutrophils Relative %: 75
Neutrophils Relative %: 75

## 2011-07-26 LAB — URINALYSIS, ROUTINE W REFLEX MICROSCOPIC
Glucose, UA: NEGATIVE
Hgb urine dipstick: NEGATIVE
Ketones, ur: NEGATIVE
Nitrite: NEGATIVE
Nitrite: NEGATIVE
Urobilinogen, UA: 1
pH: 6.5

## 2011-07-26 LAB — HOMOCYSTEINE: Homocysteine: 13.4

## 2011-07-26 LAB — LIPASE, BLOOD: Lipase: 13

## 2011-07-26 LAB — POCT I-STAT, CHEM 8
BUN: 17
Chloride: 99
Creatinine, Ser: 1.7 — ABNORMAL HIGH
Potassium: 4.2
Sodium: 133 — ABNORMAL LOW

## 2011-07-26 LAB — CARDIAC PANEL(CRET KIN+CKTOT+MB+TROPI): Troponin I: 0.04

## 2011-07-26 LAB — APTT: aPTT: 32

## 2011-07-26 LAB — GLUCOSE, CAPILLARY: Glucose-Capillary: 141 — ABNORMAL HIGH

## 2011-07-26 LAB — URINE CULTURE

## 2011-07-26 LAB — HEMOGLOBIN A1C: Mean Plasma Glucose: 126

## 2011-07-27 LAB — BASIC METABOLIC PANEL
BUN: 20
BUN: 11
BUN: 12
BUN: 23
CO2: 29
CO2: 30
CO2: 31
CO2: 31
CO2: 31
CO2: 32
Calcium: 7.6 — ABNORMAL LOW
Calcium: 7.9 — ABNORMAL LOW
Calcium: 8.1 — ABNORMAL LOW
Calcium: 8.1 — ABNORMAL LOW
Calcium: 8.2 — ABNORMAL LOW
Calcium: 8.2 — ABNORMAL LOW
Calcium: 8.4
Calcium: 8.6
Calcium: 8.9
Calcium: 8.9
Chloride: 98
Chloride: 101
Chloride: 93 — ABNORMAL LOW
Chloride: 93 — ABNORMAL LOW
Chloride: 95 — ABNORMAL LOW
Creatinine, Ser: 0.99
Creatinine, Ser: 1.05
Creatinine, Ser: 1.3 — ABNORMAL HIGH
Creatinine, Ser: 0.99
Creatinine, Ser: 1.05
Creatinine, Ser: 1.39 — ABNORMAL HIGH
Creatinine, Ser: 1.79 — ABNORMAL HIGH
GFR calc Af Amer: >60
GFR calc Af Amer: >60
GFR calc Af Amer: 33 — ABNORMAL LOW
GFR calc Af Amer: 40 — ABNORMAL LOW
GFR calc Af Amer: 43 — ABNORMAL LOW
GFR calc Af Amer: 45 — ABNORMAL LOW
GFR calc Af Amer: 51 — ABNORMAL LOW
GFR calc Af Amer: 56 — ABNORMAL LOW
GFR calc Af Amer: >60
GFR calc non Af Amer: 40 — ABNORMAL LOW
GFR calc non Af Amer: 55 — ABNORMAL LOW
GFR calc non Af Amer: 28 — ABNORMAL LOW
GFR calc non Af Amer: 36 — ABNORMAL LOW
GFR calc non Af Amer: 37 — ABNORMAL LOW
GFR calc non Af Amer: 37 — ABNORMAL LOW
GFR calc non Af Amer: 42 — ABNORMAL LOW
GFR calc non Af Amer: 46 — ABNORMAL LOW
Glucose, Bld: 110 — ABNORMAL HIGH
Glucose, Bld: 102 — ABNORMAL HIGH
Glucose, Bld: 110 — ABNORMAL HIGH
Glucose, Bld: 114 — ABNORMAL HIGH
Glucose, Bld: 114 — ABNORMAL HIGH
Glucose, Bld: 122 — ABNORMAL HIGH
Glucose, Bld: 129 — ABNORMAL HIGH
Glucose, Bld: 87
Glucose, Bld: 89
Potassium: 3.7
Potassium: 3.3 — ABNORMAL LOW
Potassium: 4
Potassium: 4.4
Potassium: 4.5
Potassium: 4.6
Sodium: 135
Sodium: 131 — ABNORMAL LOW
Sodium: 132 — ABNORMAL LOW
Sodium: 133 — ABNORMAL LOW
Sodium: 133 — ABNORMAL LOW
Sodium: 134 — ABNORMAL LOW
Sodium: 135
Sodium: 136
Sodium: 137
Sodium: 137
Sodium: 141

## 2011-07-27 LAB — URINALYSIS, ROUTINE W REFLEX MICROSCOPIC
Glucose, UA: NEGATIVE
Glucose, UA: NEGATIVE
Hgb urine dipstick: NEGATIVE
Hgb urine dipstick: NEGATIVE
Protein, ur: NEGATIVE
Specific Gravity, Urine: 1.015
Specific Gravity, Urine: 1.01
Urobilinogen, UA: 0.2
pH: 6
pH: 6

## 2011-07-27 LAB — CARDIAC PANEL(CRET KIN+CKTOT+MB+TROPI)
CK, MB: 0.6
CK, MB: 0.6
CK, MB: 0.7
Relative Index: 1.7
Relative Index: INVALID
Relative Index: INVALID
Relative Index: INVALID
Total CK: 18
Troponin I: 0.02
Troponin I: 0.02
Troponin I: <0.01

## 2011-07-27 LAB — CBC
HCT: 34.2 — ABNORMAL LOW
HCT: 35.3 — ABNORMAL LOW
HCT: 30.5 — ABNORMAL LOW
HCT: 32.3 — ABNORMAL LOW
HCT: 33 — ABNORMAL LOW
HCT: 35.4 — ABNORMAL LOW
HCT: 36.3
HCT: 37.8
Hemoglobin: 11.3 — ABNORMAL LOW
Hemoglobin: 10.3 — ABNORMAL LOW
Hemoglobin: 10.8 — ABNORMAL LOW
Hemoglobin: 11.1 — ABNORMAL LOW
Hemoglobin: 11.9 — ABNORMAL LOW
Hemoglobin: 11.9 — ABNORMAL LOW
Hemoglobin: 12.4
Hemoglobin: 12.5
MCHC: 33
MCHC: 32.7
MCHC: 32.8
MCHC: 33.4
MCHC: 33.5
MCV: 87.1
MCV: 87.6
MCV: 87.7
MCV: 87.9
Platelets: 408 — ABNORMAL HIGH
Platelets: 426 — ABNORMAL HIGH
Platelets: 219
Platelets: 251
Platelets: 488 — ABNORMAL HIGH
Platelets: 518 — ABNORMAL HIGH
Platelets: 521 — ABNORMAL HIGH
RBC: 4.04
RBC: 3.75 — ABNORMAL LOW
RBC: 4.17
RBC: 4.23
RBC: 4.29
RDW: 14.5
RDW: 14.9
RDW: 15.1
RDW: 15.2
RDW: 15.2
RDW: 15.6 — ABNORMAL HIGH
RDW: 15.6 — ABNORMAL HIGH
RDW: 15.7 — ABNORMAL HIGH
WBC: 8
WBC: 10.6 — ABNORMAL HIGH
WBC: 11.3 — ABNORMAL HIGH
WBC: 12.2 — ABNORMAL HIGH
WBC: 15.8 — ABNORMAL HIGH
WBC: 8.3
WBC: 9

## 2011-07-27 LAB — LIPID PANEL
Cholesterol: 96
Cholesterol: 163
HDL: 63
LDL Cholesterol: 71
Total CHOL/HDL Ratio: 2.6
Triglycerides: 135
Triglycerides: 144

## 2011-07-27 LAB — URINE CULTURE
Colony Count: NO GROWTH
Culture: NO GROWTH
Special Requests: NEGATIVE

## 2011-07-27 LAB — PHOSPHORUS: Phosphorus: 4.3

## 2011-07-27 LAB — GLUCOSE, CAPILLARY
Glucose-Capillary: 115 — ABNORMAL HIGH
Glucose-Capillary: 118 — ABNORMAL HIGH

## 2011-07-27 LAB — PROTIME-INR
INR: 2.9 — ABNORMAL HIGH
INR: 2.9 — ABNORMAL HIGH
INR: 1.5
INR: 1.7 — ABNORMAL HIGH
INR: 1.8 — ABNORMAL HIGH
INR: 2.1 — ABNORMAL HIGH
INR: 2.6 — ABNORMAL HIGH
Prothrombin Time: 32.5 — ABNORMAL HIGH
Prothrombin Time: 21.7 — ABNORMAL HIGH
Prothrombin Time: 23.5 — ABNORMAL HIGH
Prothrombin Time: 24.3 — ABNORMAL HIGH
Prothrombin Time: 24.7 — ABNORMAL HIGH
Prothrombin Time: 24.9 — ABNORMAL HIGH
Prothrombin Time: 25.4 — ABNORMAL HIGH
Prothrombin Time: 25.6 — ABNORMAL HIGH
Prothrombin Time: 26.5 — ABNORMAL HIGH
Prothrombin Time: 29.2 — ABNORMAL HIGH

## 2011-07-27 LAB — POCT I-STAT, CHEM 8
BUN: 14
Calcium, Ion: 1.11 — ABNORMAL LOW
Creatinine, Ser: 1.2
Glucose, Bld: 108 — ABNORMAL HIGH
Hemoglobin: 12.9
TCO2: 28

## 2011-07-27 LAB — DIFFERENTIAL
Basophils Absolute: 0.1
Basophils Relative: 2 — ABNORMAL HIGH
Eosinophils Absolute: 0.1
Eosinophils Relative: 2
Lymphocytes Relative: 31
Lymphs Abs: 2.6
Monocytes Absolute: 0.7
Monocytes Relative: 9
Neutro Abs: 4.8
Neutrophils Relative %: 57

## 2011-07-27 LAB — B-NATRIURETIC PEPTIDE (CONVERTED LAB)
Pro B Natriuretic peptide (BNP): 92.9
Pro B Natriuretic peptide (BNP): 98
Pro B Natriuretic peptide (BNP): 102 — ABNORMAL HIGH
Pro B Natriuretic peptide (BNP): 200 — ABNORMAL HIGH
Pro B Natriuretic peptide (BNP): 538 — ABNORMAL HIGH
Pro B Natriuretic peptide (BNP): 840 — ABNORMAL HIGH

## 2011-07-27 LAB — POCT CARDIAC MARKERS
CKMB, poc: <1 — ABNORMAL LOW
CKMB, poc: <1 — ABNORMAL LOW
Myoglobin, poc: 102
Myoglobin, poc: 89.5
Troponin i, poc: <0.05
Troponin i, poc: <0.05

## 2011-07-27 LAB — MAGNESIUM
Magnesium: 2.3
Magnesium: 1.8

## 2011-07-27 LAB — APTT: aPTT: 38 — ABNORMAL HIGH

## 2011-07-27 LAB — HEPARIN LEVEL (UNFRACTIONATED)
Heparin Unfractionated: 0.44
Heparin Unfractionated: 0.92 — ABNORMAL HIGH

## 2011-07-27 LAB — TROPONIN I: Troponin I: 0.01

## 2011-07-27 LAB — CK TOTAL AND CKMB (NOT AT ARMC): Relative Index: INVALID

## 2011-10-26 DIAGNOSIS — Z9289 Personal history of other medical treatment: Secondary | ICD-10-CM

## 2011-10-26 HISTORY — DX: Personal history of other medical treatment: Z92.89

## 2012-01-25 ENCOUNTER — Ambulatory Visit (HOSPITAL_COMMUNITY)
Admission: RE | Admit: 2012-01-25 | Discharge: 2012-01-25 | Disposition: A | Discharge status: Home / Self Care | Payer: Medicare Other | Source: Ambulatory Visit | Attending: Internal Medicine | Admitting: Internal Medicine

## 2012-01-25 ENCOUNTER — Other Ambulatory Visit (HOSPITAL_COMMUNITY): Payer: Self-pay | Admitting: Internal Medicine

## 2012-01-25 DIAGNOSIS — R509 Fever, unspecified: Secondary | ICD-10-CM | POA: Insufficient documentation

## 2012-01-25 DIAGNOSIS — R0602 Shortness of breath: Secondary | ICD-10-CM

## 2012-07-02 ENCOUNTER — Encounter (HOSPITAL_COMMUNITY): Payer: Self-pay | Admitting: Emergency Medicine

## 2012-07-02 ENCOUNTER — Emergency Department (HOSPITAL_COMMUNITY): Payer: Medicare Other

## 2012-07-02 ENCOUNTER — Inpatient Hospital Stay (HOSPITAL_COMMUNITY)
Admission: EM | Admit: 2012-07-02 | Discharge: 2012-07-05 | DRG: 378 | Disposition: A | Discharge status: Home / Self Care | Payer: Medicare Other | Attending: Internal Medicine | Admitting: Internal Medicine

## 2012-07-02 DIAGNOSIS — T45515A Adverse effect of anticoagulants, initial encounter: Secondary | ICD-10-CM | POA: Diagnosis present

## 2012-07-02 DIAGNOSIS — D131 Benign neoplasm of stomach: Secondary | ICD-10-CM | POA: Diagnosis present

## 2012-07-02 DIAGNOSIS — G40909 Epilepsy, unspecified, not intractable, without status epilepticus: Secondary | ICD-10-CM | POA: Diagnosis present

## 2012-07-02 DIAGNOSIS — I4891 Unspecified atrial fibrillation: Secondary | ICD-10-CM | POA: Diagnosis present

## 2012-07-02 DIAGNOSIS — Z85850 Personal history of malignant neoplasm of thyroid: Secondary | ICD-10-CM

## 2012-07-02 DIAGNOSIS — R791 Abnormal coagulation profile: Secondary | ICD-10-CM | POA: Diagnosis present

## 2012-07-02 DIAGNOSIS — D62 Acute posthemorrhagic anemia: Secondary | ICD-10-CM | POA: Diagnosis present

## 2012-07-02 DIAGNOSIS — K317 Polyp of stomach and duodenum: Secondary | ICD-10-CM

## 2012-07-02 DIAGNOSIS — Z8673 Personal history of transient ischemic attack (TIA), and cerebral infarction without residual deficits: Secondary | ICD-10-CM

## 2012-07-02 DIAGNOSIS — I509 Heart failure, unspecified: Secondary | ICD-10-CM | POA: Diagnosis present

## 2012-07-02 DIAGNOSIS — I482 Chronic atrial fibrillation, unspecified: Secondary | ICD-10-CM | POA: Diagnosis present

## 2012-07-02 DIAGNOSIS — I69998 Other sequelae following unspecified cerebrovascular disease: Secondary | ICD-10-CM

## 2012-07-02 DIAGNOSIS — I5032 Chronic diastolic (congestive) heart failure: Secondary | ICD-10-CM | POA: Diagnosis present

## 2012-07-02 DIAGNOSIS — E663 Overweight: Secondary | ICD-10-CM | POA: Diagnosis present

## 2012-07-02 DIAGNOSIS — I10 Essential (primary) hypertension: Secondary | ICD-10-CM | POA: Diagnosis present

## 2012-07-02 DIAGNOSIS — Z6841 Body Mass Index (BMI) 40.0 and over, adult: Secondary | ICD-10-CM

## 2012-07-02 DIAGNOSIS — E039 Hypothyroidism, unspecified: Secondary | ICD-10-CM | POA: Diagnosis present

## 2012-07-02 DIAGNOSIS — K922 Gastrointestinal hemorrhage, unspecified: Secondary | ICD-10-CM

## 2012-07-02 DIAGNOSIS — E876 Hypokalemia: Secondary | ICD-10-CM | POA: Diagnosis present

## 2012-07-02 DIAGNOSIS — D649 Anemia, unspecified: Secondary | ICD-10-CM

## 2012-07-02 DIAGNOSIS — K921 Melena: Principal | ICD-10-CM | POA: Diagnosis present

## 2012-07-02 DIAGNOSIS — R269 Unspecified abnormalities of gait and mobility: Secondary | ICD-10-CM | POA: Diagnosis present

## 2012-07-02 HISTORY — DX: Essential (primary) hypertension: I10

## 2012-07-02 HISTORY — DX: Heart failure, unspecified: I50.9

## 2012-07-02 HISTORY — DX: Unspecified osteoarthritis, unspecified site: M19.90

## 2012-07-02 HISTORY — DX: Unspecified convulsions: R56.9

## 2012-07-02 HISTORY — DX: Malignant (primary) neoplasm, unspecified: C80.1

## 2012-07-02 HISTORY — DX: Anemia, unspecified: D64.9

## 2012-07-02 HISTORY — DX: Disorder of thyroid, unspecified: E07.9

## 2012-07-02 HISTORY — DX: Cerebral infarction, unspecified: I63.9

## 2012-07-02 HISTORY — DX: Gastro-esophageal reflux disease without esophagitis: K21.9

## 2012-07-02 LAB — URINALYSIS, ROUTINE W REFLEX MICROSCOPIC
Bilirubin Urine: NEGATIVE
Glucose, UA: NEGATIVE mg/dL
Nitrite: NEGATIVE
Specific Gravity, Urine: 1.017 (ref 1.005–1.030)
pH: 6 (ref 5.0–8.0)

## 2012-07-02 LAB — COMPREHENSIVE METABOLIC PANEL
ALT: 13 U/L (ref 0–35)
AST: 14 U/L (ref 0–37)
Albumin: 3 g/dL — ABNORMAL LOW (ref 3.5–5.2)
Calcium: 8.2 mg/dL — ABNORMAL LOW (ref 8.4–10.5)
Creatinine, Ser: 0.84 mg/dL (ref 0.50–1.10)
Sodium: 137 mEq/L (ref 135–145)
Total Protein: 5.7 g/dL — ABNORMAL LOW (ref 6.0–8.3)

## 2012-07-02 LAB — CBC WITH DIFFERENTIAL/PLATELET
Basophils Absolute: 0 10*3/uL (ref 0.0–0.1)
Basophils Relative: 0 % (ref 0–1)
Eosinophils Relative: 1 % (ref 0–5)
HCT: 27.7 % — ABNORMAL LOW (ref 36.0–46.0)
MCHC: 33.2 g/dL (ref 30.0–36.0)
MCV: 87.7 fL (ref 78.0–100.0)
Monocytes Absolute: 1.1 10*3/uL — ABNORMAL HIGH (ref 0.1–1.0)
Platelets: 257 10*3/uL (ref 150–400)
RDW: 14.5 % (ref 11.5–15.5)
WBC: 16 10*3/uL — ABNORMAL HIGH (ref 4.0–10.5)

## 2012-07-02 LAB — TROPONIN I: Troponin I: <0.3 ng/mL (ref <0.30)

## 2012-07-02 LAB — URINE MICROSCOPIC-ADD ON

## 2012-07-02 LAB — PROTIME-INR: INR: 5.12 (ref 0.00–1.49)

## 2012-07-02 LAB — PREPARE RBC (CROSSMATCH)

## 2012-07-02 LAB — MRSA PCR SCREENING: MRSA by PCR: NEGATIVE

## 2012-07-02 MED ORDER — POTASSIUM CHLORIDE IN NACL 20-0.9 MEQ/L-% IV SOLN
INTRAVENOUS | Status: DC
  Administered 2012-07-02 – 2012-07-03 (×2): via INTRAVENOUS
  Filled 2012-07-02 (×3): qty 1000

## 2012-07-02 MED ORDER — SODIUM CHLORIDE 0.9 % IV SOLN
80.0000 mg | Freq: Once | INTRAVENOUS | Status: AC
  Administered 2012-07-02: 80 mg via INTRAVENOUS
  Filled 2012-07-02: qty 80

## 2012-07-02 MED ORDER — LEVOTHYROXINE SODIUM 88 MCG PO TABS
88.0000 ug | ORAL_TABLET | Freq: Every day | ORAL | Status: DC
  Administered 2012-07-03 – 2012-07-05 (×3): 88 ug via ORAL
  Filled 2012-07-02 (×6): qty 1

## 2012-07-02 MED ORDER — LEVETIRACETAM 500 MG PO TABS
500.0000 mg | ORAL_TABLET | Freq: Two times a day (BID) | ORAL | Status: DC
  Administered 2012-07-02 – 2012-07-05 (×6): 500 mg via ORAL
  Filled 2012-07-02 (×10): qty 1

## 2012-07-02 MED ORDER — LEVALBUTEROL HCL 0.63 MG/3ML IN NEBU
0.6300 mg | INHALATION_SOLUTION | Freq: Four times a day (QID) | RESPIRATORY_TRACT | Status: DC | PRN
  Filled 2012-07-02: qty 3

## 2012-07-02 MED ORDER — ONDANSETRON HCL 4 MG/2ML IJ SOLN: 4.0000 mg | Freq: Three times a day (TID) | INTRAMUSCULAR | Status: DC | PRN

## 2012-07-02 MED ORDER — POTASSIUM CHLORIDE 10 MEQ/100ML IV SOLN
10.0000 meq | INTRAVENOUS | Status: AC
  Administered 2012-07-02: 10 meq via INTRAVENOUS
  Filled 2012-07-02: qty 100

## 2012-07-02 MED ORDER — POTASSIUM CHLORIDE CRYS ER 20 MEQ PO TBCR
40.0000 meq | EXTENDED_RELEASE_TABLET | Freq: Two times a day (BID) | ORAL | Status: DC
  Administered 2012-07-02 – 2012-07-03 (×2): 40 meq via ORAL
  Filled 2012-07-02 (×3): qty 2

## 2012-07-02 MED ORDER — ACETAMINOPHEN 650 MG RE SUPP: 650.0000 mg | Freq: Four times a day (QID) | RECTAL | Status: DC | PRN

## 2012-07-02 MED ORDER — ATORVASTATIN CALCIUM 40 MG PO TABS
40.0000 mg | ORAL_TABLET | Freq: Every day | ORAL | Status: DC
  Administered 2012-07-03 – 2012-07-04 (×2): 40 mg via ORAL
  Filled 2012-07-02 (×4): qty 1

## 2012-07-02 MED ORDER — POTASSIUM CHLORIDE CRYS ER 20 MEQ PO TBCR
40.0000 meq | EXTENDED_RELEASE_TABLET | Freq: Once | ORAL | Status: AC
  Administered 2012-07-02: 40 meq via ORAL
  Filled 2012-07-02: qty 2

## 2012-07-02 MED ORDER — ONDANSETRON HCL 4 MG/2ML IJ SOLN
4.0000 mg | Freq: Four times a day (QID) | INTRAMUSCULAR | Status: DC | PRN
  Administered 2012-07-03 – 2012-07-04 (×2): 4 mg via INTRAVENOUS
  Filled 2012-07-02 (×2): qty 2

## 2012-07-02 MED ORDER — SODIUM CHLORIDE 0.9 % IV SOLN
8.0000 mg/h | INTRAVENOUS | Status: DC
  Administered 2012-07-02 – 2012-07-04 (×4): 8 mg/h via INTRAVENOUS
  Filled 2012-07-02 (×8): qty 80

## 2012-07-02 MED ORDER — ROPINIROLE HCL 1 MG PO TABS
2.0000 mg | ORAL_TABLET | Freq: Every day | ORAL | Status: DC
  Administered 2012-07-02 – 2012-07-04 (×3): 2 mg via ORAL
  Filled 2012-07-02 (×6): qty 2

## 2012-07-02 MED ORDER — ONDANSETRON HCL 4 MG PO TABS: 4.0000 mg | ORAL_TABLET | Freq: Four times a day (QID) | ORAL | Status: DC | PRN

## 2012-07-02 MED ORDER — ACETAMINOPHEN 325 MG PO TABS: 650.0000 mg | ORAL_TABLET | Freq: Four times a day (QID) | ORAL | Status: DC | PRN

## 2012-07-02 MED ORDER — PHYTONADIONE 5 MG PO TABS
2.5000 mg | ORAL_TABLET | Freq: Once | ORAL | Status: AC
  Administered 2012-07-02: 2.5 mg via ORAL
  Filled 2012-07-02: qty 1

## 2012-07-02 MED ORDER — SODIUM CHLORIDE 0.9 % IV SOLN
INTRAVENOUS | Status: AC
  Administered 2012-07-02: 16:00:00 via INTRAVENOUS

## 2012-07-02 MED ORDER — POTASSIUM CHLORIDE CRYS ER 20 MEQ PO TBCR: 20.0000 meq | EXTENDED_RELEASE_TABLET | Freq: Every day | ORAL | Status: DC

## 2012-07-02 NOTE — ED Notes (Signed)
Has had black tarry stools for past two days, but has had dark green stools for a month. Coumadin changed 1 month ago

## 2012-07-02 NOTE — ED Notes (Signed)
Report called to Millie, RN

## 2012-07-02 NOTE — H&P (Addendum)
Chief Complaint   Patient presents with   .  Rectal Bleeding         [name] is a 78 y.o. female hx of afib on coumadin, HTN, stroke here with melena for 3 days .She started noticing change in the color of her stool for last one month . She had greenish and dark stools for a month, but for last 3 days, she has melena. No abdominal pain. Felt nauseous but no vomiting. She is on coumadin for afib,  And takes 3  asa / day , no plavix. She felt lightheaded and weak for the last few days. No chest pain or SOB. Had nl colonoscopy 2 years ago by Dr Ewing Schlein .    Past Medical History   Diagnosis  Date   .  Hypertension    .  Stroke    .  Seizures    .  Thyroid disease     Past Surgical History   Procedure  Date   .  Abdominal hysterectomy    .  Tonsillectomy    .  Eye surgery     History reviewed. No pertinent family history.  History   Substance Use Topics   .  Smoking status:  Never Smoker   .  Smokeless tobacco:  Not on file   .  Alcohol Use:  No    OB History    Grav  Para  Term  Preterm  Abortions  TAB  SAB  Ect  Mult  Living                  Review of Systems  Gastrointestinal: Positive for blood in stool.  Neurological: Positive for weakness.  All other systems reviewed and are negative.  Denies having any recent fevers, no cough. Denies having any prior  episodes of chest pain. No orthopnea. Denies having nausea, vomiting,  abdominal pain, diarrhea. Recently she had severe constipation.  Currently she is taking MiraLax with good bowel movement. Denies having  any focal weakness, tingling, numbness.    SOCIAL HISTORY: Denies smoking, alcohol, drug use. She quit smoking  approximately 30 years ago.  FAMILY HISTORY: Cancer runs in family. Her brother had colon cancer.  Her father passed away with an MI. One of her sisters had brain cancer,  and the other sister also has heart problems.     Allergies   Adhesive  Home Medications    Current Outpatient Rx     Name  Route  Sig  Dispense  Refill   .  ASPIRIN EC 81 MG PO TBEC  Oral  Take 81 mg by mouth daily.     Marland Kitchen  BISOPROLOL-HYDROCHLOROTHIAZIDE 5-6.25 MG PO TABS  Oral  Take 1 tablet by mouth every morning.     Marland Kitchen  VITAMIN D3 5000 UNITS PO TABS  Oral  Take 5,000 Units by mouth every other day.     .  FUROSEMIDE 40 MG PO TABS  Oral  Take 40 mg by mouth every morning.     Marland Kitchen  LEVETIRACETAM 500 MG PO TABS  Oral  Take 500 mg by mouth 2 (two) times daily.     Marland Kitchen  LEVOTHYROXINE SODIUM 88 MCG PO TABS  Oral  Take 88 mcg by mouth every morning.     Marland Kitchen  MAGNESIUM 400 MG PO CAPS  Oral  Take 800 mg by mouth 3 (three) times daily.     Marland Kitchen  OVER THE COUNTER MEDICATION  Oral  Take 1 tablet by mouth every morning. Slow release iron     .  PANTOPRAZOLE SODIUM 40 MG PO TBEC  Oral  Take 40 mg by mouth daily.     Marland Kitchen  POLYETHYLENE GLYCOL 3350 PO PACK  Oral  Take 17 g by mouth daily. Dissolve in water     .  POTASSIUM CHLORIDE CRYS ER 20 MEQ PO TBCR  Oral  Take 20 mEq by mouth at bedtime.     Marland Kitchen  ROPINIROLE HCL 2 MG PO TABS  Oral  Take 2 mg by mouth at bedtime.     Marland Kitchen  ROSUVASTATIN CALCIUM 20 MG PO TABS  Oral  Take 20 mg by mouth every morning.     .  WARFARIN SODIUM 5 MG PO TABS  Oral  Take 2.5-5 mg by mouth daily. Take 5 mg on day one day alternating with 2.5 mg on days two and three.      BP 126/59  Pulse 83  Resp 16  SpO2 98%  Physical Exam  Nursing note and vitals reviewed.  Constitutional: She is oriented to person, place, and time.  Appears weak  HENT:  Head: Normocephalic.  Mouth/Throat: Oropharynx is clear and moist.  Eyes: Pupils are equal, round, and reactive to light.  Conjunctiva pale  Neck: Normal range of motion. Neck supple.  Cardiovascular: Normal rate, regular rhythm and normal heart sounds.  Pulmonary/Chest: Effort normal and breath sounds normal.  Abdominal: Soft. Bowel sounds are normal. She exhibits no distension. There is no tenderness.  Rectal- melena, guiac positive  Musculoskeletal: Normal  range of motion.  Neurological: She is alert and oriented to person, place, and time.  Skin: Skin is warm and dry.  Psychiatric: She has a normal mood and affect. Her behavior is normal. Judgment and thought content normal.   LABS    Procedures (including critical care time)  Labs Reviewed   CBC WITH DIFFERENTIAL - Abnormal; Notable for the following:    WBC  16.0 (*)      RBC  3.16 (*)      Hemoglobin  9.2 (*)      HCT  27.7 (*)      Neutro Abs  11.7 (*)      Monocytes Absolute  1.1 (*)      All other components within normal limits   COMPREHENSIVE METABOLIC PANEL - Abnormal; Notable for the following:    Potassium  2.8 (*)      Chloride  95 (*)      Glucose, Bld  121 (*)      BUN  42 (*)      Calcium  8.2 (*)      Total Protein  5.7 (*)      Albumin  3.0 (*)      Total Bilirubin  0.2 (*)      GFR calc non Af Amer  65 (*)      GFR calc Af Amer  75 (*)      All other components within normal limits   PROTIME-INR - Abnormal; Notable for the following:    Prothrombin Time  48.0 (*)      INR  5.12 (*)      All other components within normal limits   TROPONIN I   TYPE AND SCREEN   OCCULT BLOOD, POC DEVICE   ABO/RH   PREPARE RBC (CROSSMATCH)   URINALYSIS, ROUTINE W REFLEX MICROSCOPIC   PREPARE FRESH FROZEN PLASMA    Dg Chest  Portable 1 View  07/02/2012 *RADIOLOGY REPORT* Clinical Data: Weakness, shortness of breath, fever. PORTABLE CHEST - 1 VIEW Comparison: Chest radiograph 42,013 Findings: Cardiac leads project over the chest. There is stable cardiomegaly. Atherosclerotic calcification of the thoracic aortic arch. There is a focal opacity at the lateral left lung base which obscures the lateral aspect of the left hemidiaphragm. No focal opacities on the right. Right costophrenic angle is clear. No acute bony abnormality identified. IMPRESSION: Left lateral basilar opacity. This could reflect airspace disease, and/or atelectasis. A left pleural effusion cannot be excluded. Original  Report Authenticated By: Britta Mccreedy, M.D.   1.  GI bleed   2.  Anemia   3.  Hypokalemia     ASSESSMENT AND PLAN 1. Gi BLEED likely upper GI , start protonix gtt, FFP,PRBC, recheck INR in AM , Dr Dulce Sellar has been notified , he will do an EGD in AM  2. Hypokalemia  Repleate, mg level  3. HTN hold antihypertensives 4. afib rate controlled , hold coumadin

## 2012-07-02 NOTE — ED Provider Notes (Signed)
History     CSN: [id number]  Arrival date & time 07/02/12  1204   First MD Initiated Contact with Patient 07/02/12 1237      Chief Complaint  Patient presents with  . Rectal Bleeding    (Consider location/radiation/quality/duration/timing/severity/associated sxs/prior treatment) The history is provided by the patient.  [name] is a 78 y.o. female hx of afib on coumadin, HTN, stroke here with melena. She had greenish and dark stools for a month, but for last 2 days, she has melena. No abdominal pain. Felt nauseous but no vomiting. She is on coumadin for afib, no asa or plavix. She felt lightheaded and weak for the last few days. No chest pain or SOB. Had nl colonoscopy 2 years ago.    Past Medical History  Diagnosis Date  . Hypertension   . Stroke   . Seizures   . Thyroid disease     Past Surgical History  Procedure Date  . Abdominal hysterectomy   . Tonsillectomy   . Eye surgery     History reviewed. No pertinent family history.  History  Substance Use Topics  . Smoking status: Never Smoker   . Smokeless tobacco: Not on file  . Alcohol Use: No    OB History    Grav Para Term Preterm Abortions TAB SAB Ect Mult Living                  Review of Systems  Gastrointestinal: Positive for blood in stool.  Neurological: Positive for weakness.  All other systems reviewed and are negative.    Allergies  Adhesive  Home Medications   Current Outpatient Rx  Name Route Sig Dispense Refill  . ASPIRIN EC 81 MG PO TBEC Oral Take 81 mg by mouth daily.    Marland Kitchen BISOPROLOL-HYDROCHLOROTHIAZIDE 5-6.25 MG PO TABS Oral Take 1 tablet by mouth every morning.    Marland Kitchen VITAMIN D3 5000 UNITS PO TABS Oral Take 5,000 Units by mouth every other day.    . FUROSEMIDE 40 MG PO TABS Oral Take 40 mg by mouth every morning.    Marland Kitchen LEVETIRACETAM 500 MG PO TABS Oral Take 500 mg by mouth 2 (two) times daily.    Marland Kitchen LEVOTHYROXINE SODIUM 88 MCG PO TABS Oral Take 88 mcg by mouth every morning.      Marland Kitchen MAGNESIUM 400 MG PO CAPS Oral Take 800 mg by mouth 3 (three) times daily.    Marland Kitchen OVER THE COUNTER MEDICATION Oral Take 1 tablet by mouth every morning. Slow release iron    . PANTOPRAZOLE SODIUM 40 MG PO TBEC Oral Take 40 mg by mouth daily.    Marland Kitchen POLYETHYLENE GLYCOL 3350 PO PACK Oral Take 17 g by mouth daily. Dissolve in water    . POTASSIUM CHLORIDE CRYS ER 20 MEQ PO TBCR Oral Take 20 mEq by mouth at bedtime.    Marland Kitchen ROPINIROLE HCL 2 MG PO TABS Oral Take 2 mg by mouth at bedtime.    Marland Kitchen ROSUVASTATIN CALCIUM 20 MG PO TABS Oral Take 20 mg by mouth every morning.    . WARFARIN SODIUM 5 MG PO TABS Oral Take 2.5-5 mg by mouth daily. Take 5 mg on day one day alternating with 2.5 mg on days two and three.      BP 126/59  Pulse 83  Resp 16  SpO2 98%  Physical Exam  Nursing note and vitals reviewed. Constitutional: She is oriented to person, place, and time.  Appears weak  HENT:  Head: Normocephalic.  Mouth/Throat: Oropharynx is clear and moist.  Eyes: Pupils are equal, round, and reactive to light.       Conjunctiva pale  Neck: Normal range of motion. Neck supple.  Cardiovascular: Normal rate, regular rhythm and normal heart sounds.   Pulmonary/Chest: Effort normal and breath sounds normal.  Abdominal: Soft. Bowel sounds are normal. She exhibits no distension. There is no tenderness.       Rectal- melena, guiac positive   Musculoskeletal: Normal range of motion.  Neurological: She is alert and oriented to person, place, and time.  Skin: Skin is warm and dry.  Psychiatric: She has a normal mood and affect. Her behavior is normal. Judgment and thought content normal.    ED Course  Procedures (including critical care time)  Labs Reviewed  CBC WITH DIFFERENTIAL - Abnormal; Notable for the following:    WBC 16.0 (*)     RBC 3.16 (*)     Hemoglobin 9.2 (*)     HCT 27.7 (*)     Neutro Abs 11.7 (*)     Monocytes Absolute 1.1 (*)     All other components within normal limits   COMPREHENSIVE METABOLIC PANEL - Abnormal; Notable for the following:    Potassium 2.8 (*)     Chloride 95 (*)     Glucose, Bld 121 (*)     BUN 42 (*)     Calcium 8.2 (*)     Total Protein 5.7 (*)     Albumin 3.0 (*)     Total Bilirubin 0.2 (*)     GFR calc non Af Amer 65 (*)     GFR calc Af Amer 75 (*)     All other components within normal limits  PROTIME-INR - Abnormal; Notable for the following:    Prothrombin Time 48.0 (*)     INR 5.12 (*)     All other components within normal limits  TROPONIN I  TYPE AND SCREEN  OCCULT BLOOD, POC DEVICE  ABO/RH  PREPARE RBC (CROSSMATCH)  URINALYSIS, ROUTINE W REFLEX MICROSCOPIC  PREPARE FRESH FROZEN PLASMA   Dg Chest Portable 1 View  07/02/2012  *RADIOLOGY REPORT*  Clinical Data: Weakness, shortness of breath, fever.  PORTABLE CHEST - 1 VIEW  Comparison: Chest radiograph 42,013  Findings: Cardiac leads project over the chest.  There is stable cardiomegaly.  Atherosclerotic calcification of the thoracic aortic arch.  There is a focal opacity at the lateral left lung base which obscures the lateral aspect of the left hemidiaphragm.  No focal opacities on the right.  Right costophrenic angle is clear.  No acute bony abnormality identified.  IMPRESSION: Left lateral basilar opacity.  This could reflect airspace disease, and/or atelectasis.  A left pleural effusion cannot be excluded.   Original Report Authenticated By: Britta Mccreedy, M.D.      1. GI bleed   2. Anemia   3. Hypokalemia      Date: 07/02/2012  Rate: 74  Rhythm: atrial fibrillation  QRS Axis: normal  Intervals: normal  ST/T Wave abnormalities: ST depressions laterally  Conduction Disutrbances:none  Narrative Interpretation:   Old EKG Reviewed: changes noted     MDM  [name] is a 78 y.o. female hx of HTN, stroke, afib on coumadin here with melena, weakness. She is likely to have GI bleed and symptomatic anemia. Will check cbc, cmp, inr, type and screen.   2:52  PM Patient's labs showed WBC 19, Hg  9.2 (14 previously), K 2.8, and INR 5. She was given vitamin K and hospitalist requested 3U FFP. She is also transfused 1U PRBC and K was supplemented. She is admitted to Dr. Susie Cassette on stepdown.        Richardean Canal, MD 07/02/12 1455

## 2012-07-03 ENCOUNTER — Encounter (HOSPITAL_COMMUNITY)
Admission: EM | Disposition: A | Discharge status: Home / Self Care | Payer: Self-pay | Source: Home / Self Care | Attending: Internal Medicine

## 2012-07-03 ENCOUNTER — Encounter (HOSPITAL_COMMUNITY): Payer: Self-pay | Admitting: *Deleted

## 2012-07-03 DIAGNOSIS — D62 Acute posthemorrhagic anemia: Secondary | ICD-10-CM

## 2012-07-03 DIAGNOSIS — I482 Chronic atrial fibrillation, unspecified: Secondary | ICD-10-CM | POA: Diagnosis present

## 2012-07-03 DIAGNOSIS — E039 Hypothyroidism, unspecified: Secondary | ICD-10-CM | POA: Diagnosis present

## 2012-07-03 DIAGNOSIS — D689 Coagulation defect, unspecified: Secondary | ICD-10-CM

## 2012-07-03 DIAGNOSIS — T50904A Poisoning by unspecified drugs, medicaments and biological substances, undetermined, initial encounter: Secondary | ICD-10-CM

## 2012-07-03 DIAGNOSIS — I5032 Chronic diastolic (congestive) heart failure: Secondary | ICD-10-CM | POA: Diagnosis present

## 2012-07-03 DIAGNOSIS — T50901A Poisoning by unspecified drugs, medicaments and biological substances, accidental (unintentional), initial encounter: Secondary | ICD-10-CM

## 2012-07-03 DIAGNOSIS — I4891 Unspecified atrial fibrillation: Secondary | ICD-10-CM

## 2012-07-03 HISTORY — PX: ESOPHAGOGASTRODUODENOSCOPY: SHX5428

## 2012-07-03 LAB — PREPARE FRESH FROZEN PLASMA
Unit division: 0
Unit division: 0

## 2012-07-03 LAB — CBC
HCT: 26.9 % — ABNORMAL LOW (ref 36.0–46.0)
Hemoglobin: 8.2 g/dL — ABNORMAL LOW (ref 12.0–15.0)
Hemoglobin: 8.9 g/dL — ABNORMAL LOW (ref 12.0–15.0)
MCH: 29.7 pg (ref 26.0–34.0)
MCH: 29.3 pg (ref 26.0–34.0)
MCH: 29.7 pg (ref 26.0–34.0)
MCHC: 33.7 g/dL (ref 30.0–36.0)
MCHC: 33.1 g/dL (ref 30.0–36.0)
MCV: 88.5 fL (ref 78.0–100.0)
MCV: 89.4 fL (ref 78.0–100.0)
Platelets: 194 10*3/uL (ref 150–400)
Platelets: 204 10*3/uL (ref 150–400)
RBC: 2.83 MIL/uL — ABNORMAL LOW (ref 3.87–5.11)
RDW: 14.9 % (ref 11.5–15.5)
RDW: 15.4 % (ref 11.5–15.5)

## 2012-07-03 LAB — COMPREHENSIVE METABOLIC PANEL
ALT: 15 U/L (ref 0–35)
AST: 14 U/L (ref 0–37)
Alkaline Phosphatase: 53 U/L (ref 39–117)
CO2: 33 mEq/L — ABNORMAL HIGH (ref 19–32)
Chloride: 104 mEq/L (ref 96–112)
GFR calc Af Amer: 76 mL/min — ABNORMAL LOW (ref >90)
GFR calc non Af Amer: 66 mL/min — ABNORMAL LOW (ref >90)
Glucose, Bld: 95 mg/dL (ref 70–99)
Sodium: 143 mEq/L (ref 135–145)
Total Bilirubin: 0.3 mg/dL (ref 0.3–1.2)

## 2012-07-03 LAB — TROPONIN I: Troponin I: <0.3 ng/mL (ref <0.30)

## 2012-07-03 LAB — MAGNESIUM: Magnesium: 2.1 mg/dL (ref 1.5–2.5)

## 2012-07-03 SURGERY — EGD (ESOPHAGOGASTRODUODENOSCOPY)
Anesthesia: Moderate Sedation

## 2012-07-03 MED ORDER — POTASSIUM CHLORIDE CRYS ER 20 MEQ PO TBCR
20.0000 meq | EXTENDED_RELEASE_TABLET | Freq: Every day | ORAL | Status: DC
  Administered 2012-07-03 – 2012-07-04 (×2): 20 meq via ORAL
  Filled 2012-07-03 (×3): qty 1

## 2012-07-03 MED ORDER — FENTANYL CITRATE 0.05 MG/ML IJ SOLN
INTRAMUSCULAR | Status: AC
  Filled 2012-07-03: qty 2

## 2012-07-03 MED ORDER — FENTANYL CITRATE 0.05 MG/ML IJ SOLN
INTRAMUSCULAR | Status: DC | PRN
  Administered 2012-07-03: 25 ug via INTRAVENOUS

## 2012-07-03 MED ORDER — MIDAZOLAM HCL 10 MG/2ML IJ SOLN
INTRAMUSCULAR | Status: AC
  Filled 2012-07-03: qty 2

## 2012-07-03 MED ORDER — BISOPROLOL-HYDROCHLOROTHIAZIDE 5-6.25 MG PO TABS
1.0000 | ORAL_TABLET | Freq: Every morning | ORAL | Status: DC
  Administered 2012-07-03 – 2012-07-05 (×3): 1 via ORAL
  Filled 2012-07-03 (×4): qty 1

## 2012-07-03 MED ORDER — OXYCODONE-ACETAMINOPHEN 5-325 MG PO TABS: 1.0000 | ORAL_TABLET | ORAL | Status: DC | PRN

## 2012-07-03 MED ORDER — FENTANYL CITRATE 0.05 MG/ML IJ SOLN
12.5000 ug | Freq: Once | INTRAMUSCULAR | Status: AC
  Administered 2012-07-03: 12.5 ug via INTRAVENOUS

## 2012-07-03 MED ORDER — MAGNESIUM 400 MG PO CAPS: 800.0000 mg | ORAL_CAPSULE | Freq: Three times a day (TID) | ORAL | Status: DC

## 2012-07-03 MED ORDER — SODIUM CHLORIDE 0.9 % IJ SOLN
INTRAMUSCULAR | Status: DC | PRN
  Administered 2012-07-03: 09:00:00

## 2012-07-03 MED ORDER — MAGNESIUM OXIDE 400 (241.3 MG) MG PO TABS
800.0000 mg | ORAL_TABLET | Freq: Three times a day (TID) | ORAL | Status: DC
  Administered 2012-07-03 – 2012-07-05 (×6): 800 mg via ORAL
  Filled 2012-07-03 (×10): qty 2

## 2012-07-03 MED ORDER — VITAMIN D3 25 MCG (1000 UNIT) PO TABS
5000.0000 [IU] | ORAL_TABLET | ORAL | Status: DC
  Administered 2012-07-03 – 2012-07-05 (×2): 5000 [IU] via ORAL
  Filled 2012-07-03 (×2): qty 5

## 2012-07-03 MED ORDER — EPINEPHRINE HCL 0.1 MG/ML IJ SOLN
INTRAMUSCULAR | Status: AC
  Filled 2012-07-03: qty 10

## 2012-07-03 MED ORDER — FUROSEMIDE 40 MG PO TABS
40.0000 mg | ORAL_TABLET | Freq: Every morning | ORAL | Status: DC
  Administered 2012-07-03 – 2012-07-05 (×3): 40 mg via ORAL
  Filled 2012-07-03 (×4): qty 1

## 2012-07-03 MED ORDER — SODIUM CHLORIDE 0.9 % IV SOLN: INTRAVENOUS | Status: DC

## 2012-07-03 MED ORDER — MIDAZOLAM HCL 10 MG/2ML IJ SOLN
INTRAMUSCULAR | Status: DC | PRN
  Administered 2012-07-03: 2 mg via INTRAVENOUS
  Administered 2012-07-03: 1 mg via INTRAVENOUS

## 2012-07-03 MED ORDER — HYDROMORPHONE HCL PF 1 MG/ML IJ SOLN: 1.0000 mg | INTRAMUSCULAR | Status: DC | PRN

## 2012-07-03 MED ORDER — VITAMIN D3 125 MCG (5000 UT) PO TABS: 5000.0000 [IU] | ORAL_TABLET | ORAL | Status: DC

## 2012-07-03 MED ORDER — BUTAMBEN-TETRACAINE-BENZOCAINE 2-2-14 % EX AERO
INHALATION_SPRAY | CUTANEOUS | Status: DC | PRN
  Administered 2012-07-03: 2 via TOPICAL

## 2012-07-03 NOTE — Consult Note (Signed)
[facility] Gastroenterology Consultation Note  Referring Provider: Dr. Richarda Overlie [facility])  Reason for Consultation:  Melena, anemia  HPI: [name] is a 78 y.o. female admitted for melena, weakness.  For the past couple days, she has had several episodes of black tarry stools.  Is on warfarin chronically for atrial fibrillation, with INR 5.1.  She has no abdominal pain, but is a bit nauseated.  Takes ASA 3-4/week chronically as well.  No prior endoscopy.  She has received FFP and one unit of blood, and feels overall much better.  No bowel movement since admission yesterday evening.  No prior endoscopy, no prior bleeding.  Reports having colonoscopy few years ago, with polyps removed per patient.   Past Medical History  Diagnosis Date  . Hypertension   . Stroke   . Seizures   . Thyroid disease   . CHF (congestive heart failure)   . GERD (gastroesophageal reflux disease)   . Cancer     throid ca  . Arthritis     knees  . Anemia     Past Surgical History  Procedure Date  . Abdominal hysterectomy   . Tonsillectomy   . Eye surgery   . Throidectomy   . Knee surgery     Prior to Admission medications   Medication Sig Start Date End Date Taking? Authorizing Provider  aspirin EC 81 MG tablet Take 81 mg by mouth daily.   Yes Historical Provider, MD  bisoprolol-hydrochlorothiazide (ZIAC) 5-6.25 MG per tablet Take 1 tablet by mouth every morning.   Yes Historical Provider, MD  Cholecalciferol (VITAMIN D3) 5000 UNITS TABS Take 5,000 Units by mouth every other day.   Yes Historical Provider, MD  furosemide (LASIX) 40 MG tablet Take 40 mg by mouth every morning.   Yes Historical Provider, MD  levETIRAcetam (KEPPRA) 500 MG tablet Take 500 mg by mouth 2 (two) times daily.   Yes Historical Provider, MD  levothyroxine (SYNTHROID, LEVOTHROID) 88 MCG tablet Take 88 mcg by mouth every morning.   Yes Historical Provider, MD  Magnesium 400 MG CAPS Take 800 mg by mouth 3 (three) times daily.   Yes  Historical Provider, MD  OVER THE COUNTER MEDICATION Take 1 tablet by mouth every morning. Slow release iron   Yes Historical Provider, MD  pantoprazole (PROTONIX) 40 MG tablet Take 40 mg by mouth daily.   Yes Historical Provider, MD  polyethylene glycol (MIRALAX / GLYCOLAX) packet Take 17 g by mouth daily. Dissolve in water   Yes Historical Provider, MD  potassium chloride SA (K-DUR,KLOR-CON) 20 MEQ tablet Take 20 mEq by mouth at bedtime.   Yes Historical Provider, MD  rOPINIRole (REQUIP) 2 MG tablet Take 2 mg by mouth at bedtime.   Yes Historical Provider, MD  rosuvastatin (CRESTOR) 20 MG tablet Take 20 mg by mouth every morning.   Yes Historical Provider, MD  warfarin (COUMADIN) 5 MG tablet Take 2.5-5 mg by mouth daily. Take 5 mg on day one day alternating with 2.5 mg on days two and three.   Yes Historical Provider, MD    Current Facility-Administered Medications  Medication Dose Route Frequency Provider Last Rate Last Dose  . 0.9 %  sodium chloride infusion   Intravenous STAT Richardean Canal, MD 100 mL/hr at 07/02/12 1612    . 0.9 %  sodium chloride infusion   Intravenous Continuous Willis Modena, MD      . 0.9 %  sodium chloride infusion   Intravenous Continuous Willis Modena, MD      .  0.9 % NaCl with KCl 20 mEq/ L  infusion   Intravenous Continuous Richarda Overlie, MD 75 mL/hr at 07/02/12 2149    . acetaminophen (TYLENOL) tablet 650 mg  650 mg Oral Q6H PRN Richarda Overlie, MD       Or  . acetaminophen (TYLENOL) suppository 650 mg  650 mg Rectal Q6H PRN Richarda Overlie, MD      . atorvastatin (LIPITOR) tablet 40 mg  40 mg Oral q1800 Richarda Overlie, MD      . levalbuterol (XOPENEX) nebulizer solution 0.63 mg  0.63 mg Nebulization Q6H PRN Richarda Overlie, MD      . levETIRAcetam (KEPPRA) tablet 500 mg  500 mg Oral BID Richarda Overlie, MD   500 mg at 07/02/12 2240  . levothyroxine (SYNTHROID, LEVOTHROID) tablet 88 mcg  88 mcg Oral QAC breakfast Richarda Overlie, MD      . ondansetron (ZOFRAN) tablet 4 mg  4 mg  Oral Q6H PRN Richarda Overlie, MD       Or  . ondansetron (ZOFRAN) injection 4 mg  4 mg Intravenous Q6H PRN Richarda Overlie, MD   4 mg at 07/03/12 0001  . pantoprazole (PROTONIX) 80 mg in sodium chloride 0.9 % 100 mL IVPB  80 mg Intravenous Once Richardean Canal, MD   80 mg at 07/02/12 1334  . pantoprazole (PROTONIX) 80 mg in sodium chloride 0.9 % 250 mL infusion  8 mg/hr Intravenous Continuous Richarda Overlie, MD 25 mL/hr at 07/03/12 0700 8 mg/hr at 07/03/12 0700  . phytonadione (VITAMIN K) tablet 2.5 mg  2.5 mg Oral Once Richardean Canal, MD   2.5 mg at 07/02/12 1455  . potassium chloride 10 mEq in 100 mL IVPB  10 mEq Intravenous Q1 Hr x 3 Richardean Canal, MD   10 mEq at 07/02/12 1455  . potassium chloride SA (K-DUR,KLOR-CON) CR tablet 40 mEq  40 mEq Oral Once Richardean Canal, MD   40 mEq at 07/02/12 1455  . potassium chloride SA (K-DUR,KLOR-CON) CR tablet 40 mEq  40 mEq Oral BID Richarda Overlie, MD   40 mEq at 07/02/12 2240  . rOPINIRole (REQUIP) tablet 2 mg  2 mg Oral QHS Richarda Overlie, MD   2 mg at 07/02/12 2240  . DISCONTD: ondansetron (ZOFRAN) injection 4 mg  4 mg Intravenous Q8H PRN Richardean Canal, MD      . DISCONTD: potassium chloride SA (K-DUR,KLOR-CON) CR tablet 20 mEq  20 mEq Oral QHS Richarda Overlie, MD        Allergies as of 07/02/2012 - Review Complete 07/02/2012  Allergen Reaction Noted  . Adhesive (tape) Rash 07/02/2012    History reviewed. No pertinent family history.  History   Social History  . Marital Status: Married    Spouse Name: N/A    Number of Children: N/A  . Years of Education: N/A   Occupational History  . Not on file.   Social History Main Topics  . Smoking status: Never Smoker   . Smokeless tobacco: Not on file  . Alcohol Use: No  . Drug Use: No  . Sexually Active:    Other Topics Concern  . Not on file   Social History Narrative  . No narrative on file    Review of Systems: As per HPI, all others negative  Physical Exam: Vital signs in last 24 hours: Temp:  [97.6 F  (36.4 C)-98.6 F (37 C)] 98 F (36.7 C) (09/09 0812) Pulse Rate:  [71-104] 87  (09/09 0600)  Resp:  [13-21] 14  (09/09 0812) BP: (70-147)/(25-86) 147/73 mmHg (09/09 0812) SpO2:  [87 %-98 %] 97 % (09/09 0812) Weight:  [112.2 kg (247 lb 5.7 oz)-113.4 kg (250 lb)] 113.4 kg (250 lb) (09/09 0100) Last BM Date: 07/03/12 General:   Alert, overweight, somewhat pale-appearing but is in no acute distress Head:  Normocephalic and atraumatic. Eyes:  Sclera clear, no icterus.   Conjunctiva somewhat pale Ears:  Normal auditory acuity. Nose:  No deformity, discharge,  or lesions. Mouth:  No deformity or lesions.  Oropharynx pink & moist. Neck:  Supple; no masses or thyromegaly. Lungs:  Clear throughout to auscultation.   No wheezes, crackles, or rhonchi. No acute distress. Heart:  IrrRegular rate (versus regular with PVCs) and rhythm; no murmurs, clicks, rubs,  or gallops. Abdomen:  Soft, protuberant, nontender and nondistended. No masses, hepatosplenomegaly or hernias noted. Normal bowel sounds, without guarding, and without rebound.     Msk:  Symmetrical without gross deformities. Normal posture. Pulses:  Normal pulses noted. Extremities:  Without clubbing or edema. Neurologic:  Alert and  oriented x4 Skin:  Few scattered ecchymoses, otherwise Intact without significant lesions or rashes. Psych:  Alert and cooperative. Normal mood and affect.   Lab Results:  Basename 07/03/12 0328 07/02/12 1329  WBC 11.7* 16.0*  HGB 8.2* 9.2*  HCT 24.3* 27.7*  PLT 194 257   BMET  Basename 07/03/12 0328 07/02/12 1329  NA 143 137  K 4.2 2.8*  CL 104 95*  CO2 33* 32  GLUCOSE 95 121*  BUN 30* 42*  CREATININE 0.83 0.84  CALCIUM 8.0* 8.2*   LFT  Basename 07/03/12 0328  PROT 5.5*  ALBUMIN 3.0*  AST 14  ALT 15  ALKPHOS 53  BILITOT 0.3  BILIDIR --  IBILI --   PT/INR  Basename 07/03/12 0328 07/02/12 1329  LABPROT 21.7* 48.0*  INR 1.85* 5.12*    Studies/Results: Dg Chest Portable 1  View  07/02/2012  *RADIOLOGY REPORT*  Clinical Data: Weakness, shortness of breath, fever.  PORTABLE CHEST - 1 VIEW  Comparison: Chest radiograph 42,013  Findings: Cardiac leads project over the chest.  There is stable cardiomegaly.  Atherosclerotic calcification of the thoracic aortic arch.  There is a focal opacity at the lateral left lung base which obscures the lateral aspect of the left hemidiaphragm.  No focal opacities on the right.  Right costophrenic angle is clear.  No acute bony abnormality identified.  IMPRESSION: Left lateral basilar opacity.  This could reflect airspace disease, and/or atelectasis.  A left pleural effusion cannot be excluded.   Original Report Authenticated By: Britta Mccreedy, M.D.    Impression:  1.  Melena, resolved. 2.  Coagulopathy, corrected. 3.  Acute blood loss anemia.  Plan:  1.  Supportive management with PPI, serial CBCs, transfusions prn. 2.  Correct coagulopathy, as has been done. 3.  EGD today, with possible hemostatic therapy. 4.  Further recommendations pending EGD findings.   LOS: 1 day   Sedale Jenifer M  07/03/2012, 8:38 AM

## 2012-07-03 NOTE — Interval H&P Note (Signed)
History and Physical Interval Note:  07/03/2012 8:36 AM  [name]  has presented today for surgery, with the diagnosis of melena  The various methods of treatment have been discussed with the patient and family. After consideration of risks, benefits and other options for treatment, the patient has consented to  Procedure(s) (LRB) with comments: ESOPHAGOGASTRODUODENOSCOPY (EGD) (N/A) as a surgical intervention .  The patient's history has been reviewed, patient examined, no change in status, stable for surgery.  I have reviewed the patient's chart and labs.  Questions were answered to the patient's satisfaction.     Ava Tangney M  Assessment:  Melena, anemia, coagulopathy (resolved, INR 5.12-->1.85)  Plan:  1.  Upper endoscopy with possible hemostatic therapy. 2.  Risks (bleeding, infection, bowel perforation that could require surgery, sedation-related changes in cardiopulmonary systems), benefits (identification and possible treatment of source of symptoms, exclusion of certain causes of symptoms), and alternatives (watchful waiting, radiographic imaging studies, empiric medical treatment) of upper endoscopy (EGD) were explained to patient/family in detail and patient wishes to proceed.

## 2012-07-03 NOTE — Progress Notes (Signed)
CARE MANAGEMENT NOTE 07/03/2012  Patient:  OYINKANSOLA, DRONET   Account Number:  [id number]  Date Initiated:  07/03/2012  Documentation initiated by:  Elery Cadenhead  Subjective/Objective Assessment:   pt with active rectal bleeding and hypotension.  Required iv flds and bld transfusion, egd done on [id number].     Action/Plan:   from home.   Anticipated DC Date:  07/04/2012   Anticipated DC Plan:  HOME/SELF CARE  In-house referral  NA      DC Planning Services  NA      [facility] Choice  NA   Choice offered to / List presented to:  NA   DME arranged  NA      DME agency  NA     HH arranged  NA      HH agency  NA   Status of service:  In process, will continue to follow Medicare Important Message given?  NA - LOS <3 / Initial given by admissions (If response is "NO", the following Medicare IM given date fields will be blank) Date Medicare IM given:   Date Additional Medicare IM given:    Discharge Disposition:    Per UR Regulation:  Reviewed for med. necessity/level of care/duration of stay  If discussed at Long Length of Stay Meetings, dates discussed:    Comments:  [id number] Earlene Plater, RN, BSN, CCM: CHART REVIEWED AND UPDATED. NO DISCHARGE NEEDS PRESENT AT THIS TIME. CASE MANAGEMENT [phone]

## 2012-07-03 NOTE — Progress Notes (Signed)
[facility] PROGRESS NOTE  [name] GNF:[id number] DOB: June 10, 1931 DOA: 07/02/2012 PCP: [name], MD  Primary Cardiologist: Dr.Henry Katrinka Blazing  Assessment/Plan: Principal Problem:  *Acute upper GI bleed Active Problems:  Anemia, posthemorrhagic, acute  Warfarin-induced coagulopathy  A-fib  Diastolic CHF, chronic  H/O: CVA (cerebrovascular accident)  HTN (hypertension)  Gastric polyp  Seizure disorder  Hypothyroid  1. Acute upper GI bleeding, secondary to bleeding gastric polyp: Gastroenterology input appreciated. Patient status post EGD, treatment and removal of polyp on 9/9. Continue to monitor additional 24 hours in the step down unit. Per GI recommendations, hold off on Coumadin for at least a couple of weeks. May use aspirin 81 mg after 5 days. Clear liquid diet. Continue PPIs. 2. Acute blood loss anemia, secondary to GI bleed: Status post 1 PRBC. Repeat stat CBC and follow closely. Transfuse as needed. 3. Coagulopathy, secondary to Coumadin: Coumadin held. Status post 3 units FFP and vitamin K. Improved. Hold Coumadin for a couple of weeks. 4. Atrial fibrillation with controlled ventricular rate: Monitor on telemetry. Anticoagulation instructions as per problem #1. 5. History of chronic diastolic congestive heart failure: Compensated. Minimize and discontinue IV fluids ASAP. Resume diuretics. 6. History of CVA and gait instability: PT OT evaluation. 7. Hypertension: Monitor closely. Resume home beta blockers. 8. Seizure disorder: Continue Keppra. 9. Hypothyroid: Continue Synthroid.  Code Status: DO NOT RESUSCITATE Family Communication: Discussed with patient's spouse and sister-in-law at the bedside in detail, updated care and answered questions. Disposition Plan: Home when stable.   Brief narrative: 78 year old female with history of A. fib on chronic Coumadin, chronic diastolic heart failure, hypertension, prior CVA with residual right hand weakness,  was admitted on 9/8 with history of melena for 3 days. She had INR of 5.12 and hemoglobin of 9.2 on admission. She was admitted for evaluation and management of upper GI bleed. Denies history of CAD or MI.  Consultants:  Gastroenterology.  Procedures:  EGD and removal of gastric polyp on 9/9.  Antibiotics:  None.  HPI/Subjective: Says that she feels better. Stronger and no dizziness on standing. Currently denies abdominal pain. Feels hungry. No chest pain or dyspnea.  Objective: Filed Vitals:   07/03/12 0950 07/03/12 1000 07/03/12 1010 07/03/12 1020  BP: 134/71 149/52 141/70 150/53  Pulse:      Temp:      TempSrc:      Resp: 26 21 22 18   Height:      Weight:      SpO2: 94% 96% 94% 94%    Intake/Output Summary (Last 24 hours) at 07/03/12 1158 Last data filed at 07/03/12 0700  Gross per 24 hour  Intake 3038.92 ml  Output   1175 ml  Net 1863.92 ml   Filed Weights   07/02/12 1645 07/03/12 0100  Weight: 112.2 kg (247 lb 5.7 oz) 113.4 kg (250 lb)    Exam:   Moderately built and overweight female patient who is in no obvious distress. She is lying supine on bed.  Respiratory system: Clear to auscultation. No increased work of breathing.  Cardiovascular system: First and second heart sounds heard, irregularly irregular. No JVD, murmur or gallops heard. 1+ bilateral lower extremity pitting edema.  Gastrointestinal system: Abdomen is obese, soft with minimal tenderness in the periumbilical region but without rigidity, guarding or rebound. Normal bowel sounds heard.  Central nervous system: Alert and oriented x3. No focal neurological deficits.  Extremities: Symmetric 5 x 5 power.   Data Reviewed: Basic Metabolic Panel:  Lab 07/03/12 8469  07/02/12 1329  NA 143 137  K 4.2 2.8*  CL 104 95*  CO2 33* 32  GLUCOSE 95 121*  BUN 30* 42*  CREATININE 0.83 0.84  CALCIUM 8.0* 8.2*  MG 2.1 --  PHOS -- --   Liver Function Tests:  Lab 07/03/12 0328 07/02/12 1329  AST  14 14  ALT 15 13  ALKPHOS 53 52  BILITOT 0.3 0.2*  PROT 5.5* 5.7*  ALBUMIN 3.0* 3.0*   No results found for this basename: LIPASE:5,AMYLASE:5 in the last 168 hours No results found for this basename: AMMONIA:5 in the last 168 hours CBC:  Lab 07/03/12 0328 07/02/12 1329  WBC 11.7* 16.0*  NEUTROABS -- 11.7*  HGB 8.2* 9.2*  HCT 24.3* 27.7*  MCV 88.0 87.7  PLT 194 257   Cardiac Enzymes:  Lab 07/03/12 0328 07/02/12 1329  CKTOTAL -- --  CKMB -- --  CKMBINDEX -- --  TROPONINI <0.30 <0.30   BNP (last 3 results) No results found for this basename: PROBNP:3 in the last 8760 hours CBG: No results found for this basename: GLUCAP:5 in the last 168 hours  Recent Results (from the past 240 hour(s))  MRSA PCR SCREENING     Status: Normal   Collection Time   07/02/12  4:51 PM      Component Value Range Status Comment   MRSA by PCR NEGATIVE  NEGATIVE Final      Studies: Dg Chest Portable 1 View  07/02/2012  *RADIOLOGY REPORT*  Clinical Data: Weakness, shortness of breath, fever.  PORTABLE CHEST - 1 VIEW  Comparison: Chest radiograph 42,013  Findings: Cardiac leads project over the chest.  There is stable cardiomegaly.  Atherosclerotic calcification of the thoracic aortic arch.  There is a focal opacity at the lateral left lung base which obscures the lateral aspect of the left hemidiaphragm.  No focal opacities on the right.  Right costophrenic angle is clear.  No acute bony abnormality identified.  IMPRESSION: Left lateral basilar opacity.  This could reflect airspace disease, and/or atelectasis.  A left pleural effusion cannot be excluded.   Original Report Authenticated By: Britta Mccreedy, M.D.     Scheduled Meds:    . sodium chloride   Intravenous STAT  . atorvastatin  40 mg Oral q1800  . bisoprolol-hydrochlorothiazide  1 tablet Oral q morning - 10a  . fentaNYL  12.5 mcg Intravenous Once  . furosemide  40 mg Oral q morning - 10a  . levETIRAcetam  500 mg Oral BID  . levothyroxine   88 mcg Oral QAC breakfast  . Magnesium  800 mg Oral TID  . pantoprazole (PROTONIX) IV  80 mg Intravenous Once  . phytonadione  2.5 mg Oral Once  . potassium chloride  10 mEq Intravenous Q1 Hr x 3  . potassium chloride SA  20 mEq Oral QHS  . potassium chloride SA  40 mEq Oral Once  . rOPINIRole  2 mg Oral QHS  . Vitamin D3  5,000 Units Oral QODAY  . DISCONTD: potassium chloride SA  20 mEq Oral QHS  . DISCONTD: potassium chloride  40 mEq Oral BID   Continuous Infusions:    . 0.9 % NaCl with KCl 20 mEq / L 75 mL/hr at 07/03/12 1049  . pantoprozole (PROTONIX) infusion 8 mg/hr (07/03/12 0700)  . DISCONTD: sodium chloride    . DISCONTD: sodium chloride       Athanasia Stanwood  [facility] Pager [phone]. If 8PM-8AM, please contact night-coverage at [email or web address], password [facility] 07/03/2012, 11:58  AM  LOS: 1 day

## 2012-07-03 NOTE — Op Note (Signed)
[facility] [address] [place], [address]   ENDOSCOPY PROCEDURE REPORT  PATIENT: Breanna, Wade  MR#: [id number] BIRTHDATE: [birth year] , 80  yrs. old GENDER: Female ENDOSCOPIST: Willis Modena, MD REFERRED BY:  Lucky Cowboy, M.D.; Richarda Overlie, MD [facility]) PROCEDURE DATE:  07/03/2012 PROCEDURE:  EGD w/ directed submucosal injection(s), any substance, EGD w/ control of bleeding , and EGD w/ snare technique ASA CLASS:     Class III INDICATIONS:  melena.   acute post hemorrhagic anemia. MEDICATIONS: Cetacaine spray x 2, Fentanyl 25 mcg IV, and Versed 3 mg IV TOPICAL ANESTHETIC: Cetacaine Spray  DESCRIPTION OF PROCEDURE: After the risks benefits and alternatives of the procedure were thoroughly explained, informed consent was obtained.  The Pentax Gastroscope [id number] endoscope was introduced through the mouth and advanced to the second portion of the duodenum. Without limitations.  The instrument was slowly withdrawn as the mucosa was fully examined.     Findings: Normal esophagus.  In body of stomach, a flesh pedunculated polyp was seen; there was slow blood oozing from the apex of the polyp. This most likely represents the source of her bleeding.  After completion of our diagnostic exam to the second portion of the duodenum (which was normal), the base of the polyp was clipped with two hemoclips and 4 cc of diluted 1:10,000 EPI was injected around the base with good blanching effect.  A couple cold forceps biopsies were taken of the polyp post treatment, to ensure no significant bleeding; the polyp was then snared in toto with good effect.  There were no immediate complications.              The scope was then withdrawn from the patient and the procedure completed.  ENDOSCOPIC IMPRESSION:     As above.  Bleeding gastric polyp treated and removed.  RECOMMENDATIONS:     1.  Watch for potential complications of procedure. 2.  Await polypectomy  results. 3.  Would hold off on warfarin for at least a couple weeks; use of ASA 81/day is OK after 5 days. 4.  Clear liquid diet today, advancing as tolerated. 5.  Will follow.  eSigned:  Willis Modena, MD 07/03/2012 9:46 AM   CC:

## 2012-07-03 NOTE — OR Nursing (Signed)
Pt stated 5/10 upper mid abdominal pain after endoscopy procedure.  Dr. Dulce Sellar saw pt in recovery, ordered IV Fentanyl.  Administered to pt.  Fifteen minutes later pt stated still 5/10 pain in abdomen, but appeared to be in a much better mood in general. Pt stated pain tolerable at this time.  When transported pt to room, endo RN reported to floor RN about pain being unchanged but tolerable.  RN on floor will continue to monitor.  Angelique Blonder, RN

## 2012-07-04 ENCOUNTER — Encounter (HOSPITAL_COMMUNITY): Payer: Self-pay | Admitting: Gastroenterology

## 2012-07-04 LAB — CBC
HCT: 26.2 % — ABNORMAL LOW (ref 36.0–46.0)
MCHC: 32.8 g/dL (ref 30.0–36.0)
RDW: 15.3 % (ref 11.5–15.5)

## 2012-07-04 LAB — BASIC METABOLIC PANEL
BUN: 17 mg/dL (ref 6–23)
GFR calc Af Amer: 60 mL/min — ABNORMAL LOW (ref >90)
GFR calc non Af Amer: 52 mL/min — ABNORMAL LOW (ref >90)
Potassium: 3.7 mEq/L (ref 3.5–5.1)

## 2012-07-04 LAB — GLUCOSE, CAPILLARY: Glucose-Capillary: 117 mg/dL — ABNORMAL HIGH (ref 70–99)

## 2012-07-04 MED ORDER — HYDROMORPHONE HCL PF 1 MG/ML IJ SOLN: 0.5000 mg | INTRAMUSCULAR | Status: DC | PRN

## 2012-07-04 MED ORDER — PANTOPRAZOLE SODIUM 40 MG PO TBEC
40.0000 mg | DELAYED_RELEASE_TABLET | Freq: Two times a day (BID) | ORAL | Status: DC
  Administered 2012-07-04 – 2012-07-05 (×2): 40 mg via ORAL
  Filled 2012-07-04 (×5): qty 1

## 2012-07-04 NOTE — Progress Notes (Signed)
Transferred to 1418. Report given to [place], Charity fundraiser.

## 2012-07-04 NOTE — Progress Notes (Signed)
Subjective: Abdominal pain resolved. No blood in stool; no bowel movement since admission.  Objective: Vital signs in last 24 hours: Temp:  [97.7 F (36.5 C)-98.5 F (36.9 C)] 98.5 F (36.9 C) (09/10 0800) Pulse Rate:  [80-85] 83  (09/10 0800) Resp:  [18-34] 18  (09/10 0800) BP: (96-155)/(34-71) 114/60 mmHg (09/10 0800) SpO2:  [94 %-100 %] 98 % (09/10 0800) Weight change:  Last BM Date: 07/03/12  PE: GEN:  NAD ABD:  Soft  Lab Results: CBC    Component Value Date/Time   WBC 12.3* 07/04/2012 0325   RBC 2.90* 07/04/2012 0325   HGB 8.6* 07/04/2012 0325   HCT 26.2* 07/04/2012 0325   PLT 210 07/04/2012 0325   MCV 90.3 07/04/2012 0325   MCH 29.7 07/04/2012 0325   MCHC 32.8 07/04/2012 0325   RDW 15.3 07/04/2012 0325   LYMPHSABS 3.1 07/02/2012 1329   MONOABS 1.1* 07/02/2012 1329   EOSABS 0.2 07/02/2012 1329   BASOSABS 0.0 07/02/2012 1329   Assessment:  1.  Melena, resolved. 2.  Acute blood loss anemia. 3.  Hemorrhagic gastric polyp, post endoscopic polypectomy.  Plan:  1.  Change PPI gtt to 40 mg po bid. 2.  Soft mechanical diet ok, advance as tolerated. 3.  Await polypectomy results. 4.  No warfarin x 2 weeks. 5.  ASA 81 mg/day ok in 5 days. 6.  Hopefully home in 1-2 days. 7.  Will follow.   [name] 07/04/2012, 9:20 AM

## 2012-07-04 NOTE — Evaluation (Signed)
Physical Therapy Evaluation Patient Details Name: [name] MRN: [id number] DOB: 02-Jan-1931 Today's Date: 07/04/2012 Time: 4782-9562 PT Time Calculation (min): 28 min  PT Assessment / Plan / Recommendation Clinical Impression      Pt was admitted on 9/8 for GI bleed. Pt resides at home w/ spouse and 24/7 caregiver. Pt. will benefit fromPT to improve infunctonal mobility and strength to DC to home.               PT Assessment  Patient needs continued PT services    Follow Up Recommendations  No PT follow up (spouse and pt decline need)    Barriers to Discharge        Equipment Recommendations  None recommended by PT    Recommendations for Other Services     Frequency Min 3X/week    Precautions / Restrictions Precautions Precautions: Fall Precaution Comments: dizziness   Pertinent Vitals/Pain Pre HR 83 Sats  RA 96% 118/43(sup) During   101              100   131/72       Mobility  Bed Mobility Bed Mobility: Supine to Sit Supine to Sit: 4: Min assist Details for Bed Mobility Assistance: Pt. used UE support to move to sitting  at egde of bed. Transfers Transfers: Sit to Stand;Stand to Sit Sit to Stand: 4: Min assist Stand to Sit: 4: Min guard Details for Transfer Assistance: VC to push self to standing and to sit down, reaching to recliner Ambulation/Gait Ambulation/Gait Assistance: 1: +2 Total assist Ambulation/Gait: Patient Percentage: 70% Ambulation Distance (Feet): 120 Feet Assistive device: Rolling walker Ambulation/Gait Assistance Details: Pt. moves slowly , pt.   did push self to ambulate  in hall. Gait Pattern: Step-to pattern Gait velocity: decreased    Exercises     PT Diagnosis: Difficulty walking;Generalized weakness  PT Problem List: Decreased strength;Decreased activity tolerance;Decreased mobility PT Treatment Interventions: DME instruction;Gait training;Functional mobility training;Therapeutic activities;Patient/family  education;Neuromuscular re-education   PT Goals Acute Rehab PT Goals PT Goal Formulation: With patient/family Time For Goal Achievement: 07/18/12 Potential to Achieve Goals: Good Pt will go Supine/Side to Sit: with supervision PT Goal: Supine/Side to Sit - Progress: Goal set today Pt will go Sit to Supine/Side: with supervision PT Goal: Sit to Supine/Side - Progress: Goal set today Pt will go Sit to Stand: with supervision PT Goal: Sit to Stand - Progress: Goal set today Pt will go Stand to Sit: with supervision PT Goal: Stand to Sit - Progress: Goal set today Pt will Ambulate: >150 feet;with supervision;with rolling walker PT Goal: Ambulate - Progress: Goal set today  Visit Information  Last PT Received On: 07/04/12 Assistance Needed: +2    Subjective Data  Subjective: I want to walk so I can go home Patient Stated Goal: To go home   Prior Functioning  Home Living Lives With: Spouse Available Help at Discharge: Family Type of Home: House Home Access: Stairs to enter Secretary/administrator of Steps: 3 Entrance Stairs-Rails: Right Home Layout: One level Bathroom Shower/Tub: Engineer, manufacturing systems: Standard Home Adaptive Equipment: Bedside commode/3-in-1;Walker - four wheeled    Cognition  Overall Cognitive Status: Appears within functional limits for tasks assessed/performed Arousal/Alertness: Awake/alert Orientation Level: Appears intact for tasks assessed Behavior During Session: [facility] for tasks performed    Extremity/Trunk Assessment Right Upper Extremity Assessment RUE ROM/Strength/Tone: [facility] for tasks assessed Left Upper Extremity Assessment LUE ROM/Strength/Tone: [facility] for tasks assessed Right Lower Extremity Assessment RLE ROM/Strength/Tone: [facility] for  tasks assessed Left Lower Extremity Assessment LLE ROM/Strength/Tone: [facility] for tasks assessed   Balance Static Sitting Balance Static Sitting - Balance Support: No upper extremity supported;Feet  supported Static Sitting - Level of Assistance: 5: Stand by assistance  End of Session PT - End of Session Activity Tolerance: Patient tolerated treatment well Patient left: in chair;with call bell/phone within reach;with family/visitor present Nurse Communication: Mobility status  GP     Rada Hay 07/04/2012, 12:05 PM  [phone]

## 2012-07-04 NOTE — Progress Notes (Signed)
[facility] PROGRESS NOTE  [name] ZOX:[id number] DOB: 1931/07/11 DOA: 07/02/2012 PCP: [name], MD  Primary Cardiologist: Dr.Henry Katrinka Blazing  Assessment/Plan: Principal Problem:  *Acute upper GI bleed Active Problems:  Anemia, posthemorrhagic, acute  Warfarin-induced coagulopathy  A-fib  Diastolic CHF, chronic  H/O: CVA (cerebrovascular accident)  HTN (hypertension)  Gastric polyp  Seizure disorder  Hypothyroid  1. Acute upper GI bleeding, secondary to bleeding gastric polyp: Gastroenterology following. Patient status post EGD, treatment and removal of polyp on 9/9. Per GI recommendations, hold off on Coumadin for 2 weeks. May use aspirin 81 mg in 5 days. Advancing diet. Changed PPI to twice a day. Resolved. 2. Acute blood loss anemia, secondary to GI bleed: Status post 1 PRBC. Stable. Follow CBC in a.m. 3. Coagulopathy, secondary to Coumadin: Coumadin held. Status post 3 units FFP and vitamin K. Improved. Hold Coumadin for 2 weeks. 4. Atrial fibrillation with controlled ventricular rate: Monitor on telemetry. Anticoagulation instructions as per problem #1. 5. History of chronic diastolic congestive heart failure: Compensated. Discontinue IV fluids. Resumed diuretics. 6. History of CVA and gait instability: PT OT evaluation currently evaluating. 7. Hypertension: Monitor closely. Resume home beta blockers. Controlled 8. Seizure disorder: Continue Keppra. 9. Hypothyroid: Continue Synthroid.  Code Status: DO NOT RESUSCITATE Family Communication: Discussed with patient's spouse and daughter at the bedside in detail, updated care and answered questions. Disposition Plan: Transfer to telemetry today. Home 1- 2 days.   Brief narrative: 78 year old female with history of A. fib on chronic Coumadin, chronic diastolic heart failure, hypertension, prior CVA with residual right hand weakness, was admitted on 9/8 with history of melena for 3 days. She had INR of 5.12  and hemoglobin of 9.2 on admission. She was admitted for evaluation and management of upper GI bleed. Denies history of CAD or MI.  Consultants:  Gastroenterology.  Procedures:  EGD and removal of gastric polyp on 9/9.  Antibiotics:  None.  HPI/Subjective: No abdominal pain. Tolerated breakfast-scrambled eggs and muffins. No BM since admission. No dizziness/lightheadedness.  Objective: Filed Vitals:   07/03/12 2100 07/04/12 0000 07/04/12 0400 07/04/12 0800  BP: 115/56 116/54 96/34 114/60  Pulse:  80 82 83  Temp: 98.3 F (36.8 C) 97.9 F (36.6 C) 98.1 F (36.7 C) 98.5 F (36.9 C)  TempSrc: Oral Oral Oral Oral  Resp:  18  18  Height:      Weight:      SpO2:  99% 94% 98%    Intake/Output Summary (Last 24 hours) at 07/04/12 1054 Last data filed at 07/04/12 0900  Gross per 24 hour  Intake   2410 ml  Output   5075 ml  Net  -2665 ml   Filed Weights   07/02/12 1645 07/03/12 0100  Weight: 112.2 kg (247 lb 5.7 oz) 113.4 kg (250 lb)    Exam:   Moderately built and overweight female patient who is in no obvious distress. Sitting up chair and PT working with her.  Respiratory system: Clear to auscultation. No increased work of breathing.  Cardiovascular system: First and second heart sounds heard, irregularly irregular. No JVD, murmur or gallops heard. 1+ bilateral lower extremity pitting edema. Telemetry shows rate controlled A. fib.  Gastrointestinal system: Abdomen is obese, soft and nontender. Normal bowel sounds heard.  Central nervous system: Alert and oriented x3. No focal neurological deficits.  Extremities: Symmetric 5 x 5 power.   Data Reviewed: Basic Metabolic Panel:  Lab 07/04/12 8119 07/03/12 0328 07/02/12 1329  NA 141 143 137  K 3.7 4.2 2.8*  CL 103 104 95*  CO2 33* 33* 32  GLUCOSE 104* 95 121*  BUN 17 30* 42*  CREATININE 1.01 0.83 0.84  CALCIUM 7.8* 8.0* 8.2*  MG -- 2.1 --  PHOS -- -- --   Liver Function Tests:  Lab 07/03/12 0328  07/02/12 1329  AST 14 14  ALT 15 13  ALKPHOS 53 52  BILITOT 0.3 0.2*  PROT 5.5* 5.7*  ALBUMIN 3.0* 3.0*   No results found for this basename: LIPASE:5,AMYLASE:5 in the last 168 hours No results found for this basename: AMMONIA:5 in the last 168 hours CBC:  Lab 07/04/12 0325 07/03/12 2043 07/03/12 1250 07/03/12 0328 07/02/12 1329  WBC 12.3* 12.0* 11.0* 11.7* 16.0*  NEUTROABS -- -- -- -- 11.7*  HGB 8.6* 8.4* 8.9* 8.2* 9.2*  HCT 26.2* 25.3* 26.9* 24.3* 27.7*  MCV 90.3 89.4 88.5 88.0 87.7  PLT 210 204 188 194 257   Cardiac Enzymes:  Lab 07/03/12 0328 07/02/12 1329  CKTOTAL -- --  CKMB -- --  CKMBINDEX -- --  TROPONINI <0.30 <0.30   BNP (last 3 results) No results found for this basename: PROBNP:3 in the last 8760 hours CBG: No results found for this basename: GLUCAP:5 in the last 168 hours  Recent Results (from the past 240 hour(s))  MRSA PCR SCREENING     Status: Normal   Collection Time   07/02/12  4:51 PM      Component Value Range Status Comment   MRSA by PCR NEGATIVE  NEGATIVE Final      Studies: Dg Chest Portable 1 View  07/02/2012  *RADIOLOGY REPORT*  Clinical Data: Weakness, shortness of breath, fever.  PORTABLE CHEST - 1 VIEW  Comparison: Chest radiograph 42,013  Findings: Cardiac leads project over the chest.  There is stable cardiomegaly.  Atherosclerotic calcification of the thoracic aortic arch.  There is a focal opacity at the lateral left lung base which obscures the lateral aspect of the left hemidiaphragm.  No focal opacities on the right.  Right costophrenic angle is clear.  No acute bony abnormality identified.  IMPRESSION: Left lateral basilar opacity.  This could reflect airspace disease, and/or atelectasis.  A left pleural effusion cannot be excluded.   Original Report Authenticated By: Britta Mccreedy, M.D.     Scheduled Meds:    . atorvastatin  40 mg Oral q1800  . bisoprolol-hydrochlorothiazide  1 tablet Oral q morning - 10a  . cholecalciferol  5,000  Units Oral QODAY  . furosemide  40 mg Oral q morning - 10a  . levETIRAcetam  500 mg Oral BID  . levothyroxine  88 mcg Oral QAC breakfast  . magnesium oxide  800 mg Oral TID  . pantoprazole  40 mg Oral BID AC  . potassium chloride SA  20 mEq Oral QHS  . rOPINIRole  2 mg Oral QHS  . DISCONTD: Magnesium  800 mg Oral TID  . DISCONTD: potassium chloride  40 mEq Oral BID  . DISCONTD: Vitamin D3  5,000 Units Oral QODAY   Continuous Infusions:    . DISCONTD: 0.9 % NaCl with KCl 20 mEq / L 40 mL/hr at 07/03/12 1303  . DISCONTD: pantoprozole (PROTONIX) infusion 8 mg/hr (07/04/12 0320)     [facility]  [facility] Pager [phone]. If 8PM-8AM, please contact night-coverage at [email or web address], password [facility] 07/04/2012, 10:54 AM  LOS: 2 days

## 2012-07-04 NOTE — Progress Notes (Signed)
Dr Dulce Sellar rounded on patient this am, advanced to soft mechanical diet. VSS, no complaints, husband and family supportive at bedside. Good appetite noted, up to bedside commode standby assistance x 1.

## 2012-07-05 LAB — CBC
MCH: 29.7 pg (ref 26.0–34.0)
MCHC: 32.9 g/dL (ref 30.0–36.0)
MCV: 90.3 fL (ref 78.0–100.0)
Platelets: 219 10*3/uL (ref 150–400)
RBC: 2.69 MIL/uL — ABNORMAL LOW (ref 3.87–5.11)
RDW: 15.2 % (ref 11.5–15.5)

## 2012-07-05 MED ORDER — PANTOPRAZOLE SODIUM 40 MG PO TBEC: 40.0000 mg | DELAYED_RELEASE_TABLET | Freq: Two times a day (BID) | ORAL | Status: DC

## 2012-07-05 NOTE — Plan of Care (Signed)
Problem: Phase I Progression Outcomes Goal: EF % per last Echo/documented,Core Reminder form on chart Outcome: Completed/Met Date Met:  07/05/12 EF=55-65% per 2D echo completed 07/12/2008

## 2012-07-05 NOTE — Progress Notes (Signed)
Subjective: Little black stool, first stool in a couple days.   No abdominal pain. Tolerating diet.  Objective: Vital signs in last 24 hours: Temp:  [97.6 F (36.4 C)-98.7 F (37.1 C)] 98 F (36.7 C) (09/11 0526) Pulse Rate:  [71-95] 77  (09/11 0526) Resp:  [16-21] 19  (09/10 2106) BP: (97-125)/(43-78) 109/62 mmHg (09/11 0526) SpO2:  [95 %-98 %] 95 % (09/11 0526) Weight change:  Last BM Date: 07/04/12  PE: GEN:  Overweight, NAD ABD:  Protuberant, soft  Lab Results: CBC    Component Value Date/Time   WBC 11.1* 07/05/2012 0454   RBC 2.69* 07/05/2012 0454   HGB 8.0* 07/05/2012 0454   HCT 24.3* 07/05/2012 0454   PLT 219 07/05/2012 0454   MCV 90.3 07/05/2012 0454   MCH 29.7 07/05/2012 0454   MCHC 32.9 07/05/2012 0454   RDW 15.2 07/05/2012 0454   LYMPHSABS 3.1 07/02/2012 1329   MONOABS 1.1* 07/02/2012 1329   EOSABS 0.2 07/02/2012 1329   BASOSABS 0.0 07/02/2012 1329   EGD Gastric polyp:  Hyperplastic polyp with surface erosion.  Assessment:  1.  GI bleeding from bleeding gastric polyp, resolved. 2.  Anemia, acute blood loss. 3.  Hyperplastic gastric polyp, removed.  Plan:  1.  No warfarin x 2 weeks, no ASA x 5 days. 2.  OK to discharge from GI standpoint, with close outpatient follow-up with Dr. Ewing Schlein, her primary gastroenterologist. 3.  Will defer to primary team whether patient needs PT evaluation, etc, pre-discharge. 4.  Protonix 40 mg po bid x 8-12 weeks. 5.  Will sign off; please call with questions; thank you for the consult.   [name] 07/05/2012, 8:54 AM

## 2012-07-05 NOTE — Care Management Note (Signed)
    Page 1 of 2   07/05/2012     1:48:40 PM   CARE MANAGEMENT NOTE 07/05/2012  Patient:  [name], [name]   Account Number:  [id number]  Date Initiated:  07/03/2012  Documentation initiated by:  DAVIS,RHONDA  Subjective/Objective Assessment:   pt with active rectal bleeding and hypotension.  Required iv flds and bld transfusion, egd done on [id number].     Action/Plan:   from home.   Anticipated DC Date:  07/05/2012   Anticipated DC Plan:  HOME/SELF CARE  In-house referral  NA      DC Planning Services  NA      PAC Choice  NA   Choice offered to / List presented to:  NA   DME arranged  NA      DME agency  NA     HH arranged  NA      HH agency  NA   Status of service:  Completed, signed off Medicare Important Message given?  NA - LOS <3 / Initial given by admissions (If response is "NO", the following Medicare IM given date fields will be blank) Date Medicare IM given:   Date Additional Medicare IM given:    Discharge Disposition:  HOME/SELF CARE  Per UR Regulation:  Reviewed for med. necessity/level of care/duration of stay  If discussed at Long Length of Stay Meetings, dates discussed:    Comments:  [id number] Earlene Plater, RN, BSN, CCM: CHART REVIEWED AND UPDATED. NO DISCHARGE NEEDS PRESENT AT THIS TIME. CASE MANAGEMENT [phone]

## 2012-07-05 NOTE — Discharge Summary (Signed)
Physician Discharge Summary  [name] MRN: [id number] DOB/AGE: [birth year] 78 y.o.  PCP: [name], MD   Admit date: 07/02/2012 Discharge date: 07/05/2012  Discharge Diagnoses:     *Acute upper GI bleed Active Problems:  Anemia, posthemorrhagic, acute  Warfarin-induced coagulopathy  A-fib  Diastolic CHF, chronic  H/O: CVA (cerebrovascular accident)  HTN (hypertension)  Gastric polyp  Seizure disorder  Hypothyroid     Medication List     As of 07/05/2012 12:42 PM    STOP taking these medications         aspirin EC 81 MG tablet      warfarin 5 MG tablet   Commonly known as: COUMADIN      TAKE these medications         bisoprolol-hydrochlorothiazide 5-6.25 MG per tablet   Commonly known as: ZIAC   Take 1 tablet by mouth every morning.      furosemide 40 MG tablet   Commonly known as: LASIX   Take 40 mg by mouth every morning.      levETIRAcetam 500 MG tablet   Commonly known as: KEPPRA   Take 500 mg by mouth 2 (two) times daily.      levothyroxine 88 MCG tablet   Commonly known as: SYNTHROID, LEVOTHROID   Take 88 mcg by mouth every morning.      Magnesium 400 MG Caps   Take 800 mg by mouth 3 (three) times daily.      OVER THE COUNTER MEDICATION   Take 1 tablet by mouth every morning. Slow release iron      pantoprazole 40 MG tablet   Commonly known as: PROTONIX   Take 1 tablet (40 mg total) by mouth 2 (two) times daily before a meal.      polyethylene glycol packet   Commonly known as: MIRALAX / GLYCOLAX   Take 17 g by mouth daily. Dissolve in water      potassium chloride SA 20 MEQ tablet   Commonly known as: K-DUR,KLOR-CON   Take 20 mEq by mouth at bedtime.      rOPINIRole 2 MG tablet   Commonly known as: REQUIP   Take 2 mg by mouth at bedtime.      rosuvastatin 20 MG tablet   Commonly known as: CRESTOR   Take 20 mg by mouth every morning.      Vitamin D3 5000 UNITS Tabs   Take 5,000 Units by mouth every other  day.        Discharge Condition: Stable Disposition:  home   Consults:  Gastroenterology  Significant Diagnostic Studies: Dg Chest Portable 1 View  07/02/2012  *RADIOLOGY REPORT*  Clinical Data: Weakness, shortness of breath, fever.  PORTABLE CHEST - 1 VIEW  Comparison: Chest radiograph 42,013  Findings: Cardiac leads project over the chest.  There is stable cardiomegaly.  Atherosclerotic calcification of the thoracic aortic arch.  There is a focal opacity at the lateral left lung base which obscures the lateral aspect of the left hemidiaphragm.  No focal opacities on the right.  Right costophrenic angle is clear.  No acute bony abnormality identified.  IMPRESSION: Left lateral basilar opacity.  This could reflect airspace disease, and/or atelectasis.  A left pleural effusion cannot be excluded.   Original Report Authenticated By: Britta Mccreedy, M.D.     Upper endoscopy ENDOSCOPIC IMPRESSION: As above. Bleeding gastric polyp treated  and removed.  RECOMMENDATIONS: 1. Watch for potential complications of  procedure.  2. Await polypectomy  results.  3. Would hold off on warfarin for at least a couple weeks; use of  ASA 81/day is OK after 5 days.  4. Clear liquid diet today, advancing as tolerated.     Microbiology: Recent Results (from the past 240 hour(s))  MRSA PCR SCREENING     Status: Normal   Collection Time   07/02/12  4:51 PM      Component Value Range Status Comment   MRSA by PCR NEGATIVE  NEGATIVE Final      Labs: Results for orders placed during the hospital encounter of 07/02/12 (from the past 48 hour(s))  CBC     Status: Abnormal   Collection Time   07/03/12 12:50 PM      Component Value Range Comment   WBC 11.0 (*) 4.0 - 10.5 K/uL    RBC 3.04 (*) 3.87 - 5.11 MIL/uL    Hemoglobin 8.9 (*) 12.0 - 15.0 g/dL    HCT 16.1 (*) 09.6 - 46.0 %    MCV 88.5  78.0 - 100.0 fL    MCH 29.3  26.0 - 34.0 pg    MCHC 33.1  30.0 - 36.0 g/dL    RDW 04.5  40.9 - 81.1 %    Platelets  188  150 - 400 K/uL   CBC     Status: Abnormal   Collection Time   07/03/12  8:43 PM      Component Value Range Comment   WBC 12.0 (*) 4.0 - 10.5 K/uL    RBC 2.83 (*) 3.87 - 5.11 MIL/uL    Hemoglobin 8.4 (*) 12.0 - 15.0 g/dL    HCT 91.4 (*) 78.2 - 46.0 %    MCV 89.4  78.0 - 100.0 fL    MCH 29.7  26.0 - 34.0 pg    MCHC 33.2  30.0 - 36.0 g/dL    RDW 95.6  21.3 - 08.6 %    Platelets 204  150 - 400 K/uL   CBC     Status: Abnormal   Collection Time   07/04/12  3:25 AM      Component Value Range Comment   WBC 12.3 (*) 4.0 - 10.5 K/uL    RBC 2.90 (*) 3.87 - 5.11 MIL/uL    Hemoglobin 8.6 (*) 12.0 - 15.0 g/dL    HCT 57.8 (*) 46.9 - 46.0 %    MCV 90.3  78.0 - 100.0 fL    MCH 29.7  26.0 - 34.0 pg    MCHC 32.8  30.0 - 36.0 g/dL    RDW 62.9  52.8 - 41.3 %    Platelets 210  150 - 400 K/uL   BASIC METABOLIC PANEL     Status: Abnormal   Collection Time   07/04/12  3:25 AM      Component Value Range Comment   Sodium 141  135 - 145 mEq/L    Potassium 3.7  3.5 - 5.1 mEq/L    Chloride 103  96 - 112 mEq/L    CO2 33 (*) 19 - 32 mEq/L    Glucose, Bld 104 (*) 70 - 99 mg/dL    BUN 17  6 - 23 mg/dL    Creatinine, Ser 2.44  0.50 - 1.10 mg/dL    Calcium 7.8 (*) 8.4 - 10.5 mg/dL    GFR calc non Af Amer 52 (*) >90 mL/min    GFR calc Af Amer 60 (*) >90 mL/min   PROTIME-INR     Status: Abnormal  Collection Time   07/04/12  3:25 AM      Component Value Range Comment   Prothrombin Time 22.3 (*) 11.6 - 15.2 seconds    INR 1.92 (*) 0.00 - 1.49   GLUCOSE, CAPILLARY     Status: Abnormal   Collection Time   07/04/12 10:11 PM      Component Value Range Comment   Glucose-Capillary 117 (*) 70 - 99 mg/dL   CBC     Status: Abnormal   Collection Time   07/05/12  4:54 AM      Component Value Range Comment   WBC 11.1 (*) 4.0 - 10.5 K/uL    RBC 2.69 (*) 3.87 - 5.11 MIL/uL    Hemoglobin 8.0 (*) 12.0 - 15.0 g/dL    HCT 46.9 (*) 62.9 - 46.0 %    MCV 90.3  78.0 - 100.0 fL    MCH 29.7  26.0 - 34.0 pg    MCHC 32.9   30.0 - 36.0 g/dL    RDW 52.8  41.3 - 24.4 %    Platelets 219  150 - 400 K/uL      HPI :78 y.o. female admitted for melena, weakness. For the past couple days, she has had several episodes of black tarry stools. Is on warfarin chronically for atrial fibrillation, with INR 5.1. She has no abdominal pain, but is a bit nauseated. Takes ASA 3-4/week chronically as well. No prior endoscopy. She has received FFP and one unit of blood, and feels overall much better. No bowel movement since admission yesterday evening. No prior endoscopy, no prior bleeding. Reports having colonoscopy few years ago, with polyps removed per patient.   HOSPITAL COURSE:  Acute upper GI bleeding, secondary to bleeding gastric polyp based on endoscopy results, presenting hemoglobin of 8.2: Gastroenterology following. Patient status post EGD, treatment and removal of polyp on 9/9. Per GI recommendations, hold off on Coumadin for 2 weeks. May use aspirin 81 mg in 5 days. Advancing diet. Changed PPI to twice a day for at least 3 months. with close outpatient follow-up with Dr. Ewing Schlein, her primary gastroenterologist 1.  2. Acute blood loss anemia, secondary to GI bleed: Status post 1 PRBC. Stable. Hemoglobin stable around 8.0. 3. Coagulopathy, secondary to Coumadin: Coumadin held. Status post 3 units FFP and vitamin K. Improved. Hold Coumadin for 2 weeks, resume only if okay with gastroenterology. 4. Atrial fibrillation with controlled ventricular rate: Monitor on telemetry. Anticoagulation instructions as per problem #1. 5. History of chronic diastolic congestive heart failure: Compensated. Diuretics were held initially but now resumed 6. History of CVA and gait instability: No skilled PT needs at this time. 7. Hypertension: Monitor closely. Resume home beta blockers. Controlled 8. Seizure disorder: Continue Keppra. 9. Hypothyroid: Continue Synthroid. 10.    Discharge Exam:  Blood pressure 109/62, pulse 77, temperature 98 F  (36.7 C), temperature source Oral, resp. rate 19, height 5\' 4"  (1.626 m), weight 113.4 kg (250 lb), SpO2 95.00%.   General: Alert, overweight, somewhat pale-appearing but is in no acute distress  Head: Normocephalic and atraumatic.  Eyes: Sclera clear, no icterus. Conjunctiva somewhat pale  Ears: Normal auditory acuity.  Nose: No deformity, discharge, or lesions.  Mouth: No deformity or lesions. Oropharynx pink & moist.  Neck: Supple; no masses or thyromegaly.  Lungs: Clear throughout to auscultation. No wheezes, crackles, or rhonchi. No acute distress.  Heart: IrrRegular rate (versus regular with PVCs) and rhythm; no murmurs, clicks, rubs, or gallops.  Abdomen: Soft, protuberant, nontender and  nondistended. No masses, hepatosplenomegaly or hernias noted. Normal bowel sounds, without guarding, and without rebound.  Msk: Symmetrical without gross deformities. Normal posture.  Pulses: Normal pulses noted.  Extremities: Without clubbing or edema.  Neurologic: Alert and oriented x4  Skin: Few scattered ecchymoses, otherwise Intact without significant lesions or rashes.  Psych: Alert and cooperative. Normal mood and affect.       Discharge Orders    Future Orders Please Complete By Expires   Diet - low sodium heart healthy      Increase activity slowly         Follow-up Information    Follow up with [facility] E, MD. In 1 week.   Contact information:   [address] [place] [address] [phone]       Schedule an appointment as soon as possible for a visit with Primary care provider. (CBC in one week)          Signed: Richarda Overlie 07/05/2012, 12:42 PM

## 2012-07-06 LAB — TYPE AND SCREEN
ABO/RH(D): O POS
Antibody Screen: NEGATIVE
Unit division: 0

## 2013-01-11 ENCOUNTER — Ambulatory Visit (HOSPITAL_COMMUNITY)
Admission: RE | Admit: 2013-01-11 | Discharge: 2013-01-11 | Disposition: A | Discharge status: Home / Self Care | Payer: 59 | Source: Ambulatory Visit | Attending: Internal Medicine | Admitting: Internal Medicine

## 2013-01-11 ENCOUNTER — Other Ambulatory Visit (HOSPITAL_COMMUNITY): Payer: Self-pay | Admitting: Internal Medicine

## 2013-01-11 DIAGNOSIS — Z8673 Personal history of transient ischemic attack (TIA), and cerebral infarction without residual deficits: Secondary | ICD-10-CM | POA: Insufficient documentation

## 2013-01-11 DIAGNOSIS — R071 Chest pain on breathing: Secondary | ICD-10-CM | POA: Insufficient documentation

## 2013-01-11 DIAGNOSIS — R0602 Shortness of breath: Secondary | ICD-10-CM | POA: Insufficient documentation

## 2013-01-11 DIAGNOSIS — I509 Heart failure, unspecified: Secondary | ICD-10-CM | POA: Insufficient documentation

## 2013-03-21 LAB — FECAL OCCULT BLOOD, GUAIAC: Fecal Occult Blood: NEGATIVE

## 2013-07-27 ENCOUNTER — Other Ambulatory Visit: Payer: Self-pay | Admitting: Gastroenterology

## 2013-08-09 ENCOUNTER — Encounter (HOSPITAL_COMMUNITY): Payer: Self-pay | Admitting: Emergency Medicine

## 2013-08-09 ENCOUNTER — Inpatient Hospital Stay (HOSPITAL_COMMUNITY)
Admission: EM | Admit: 2013-08-09 | Discharge: 2013-08-11 | DRG: 392 | Disposition: A | Discharge status: Home / Self Care | Payer: Medicare Other | Attending: Internal Medicine | Admitting: Internal Medicine

## 2013-08-09 DIAGNOSIS — K219 Gastro-esophageal reflux disease without esophagitis: Secondary | ICD-10-CM | POA: Diagnosis present

## 2013-08-09 DIAGNOSIS — Z8673 Personal history of transient ischemic attack (TIA), and cerebral infarction without residual deficits: Secondary | ICD-10-CM

## 2013-08-09 DIAGNOSIS — I10 Essential (primary) hypertension: Secondary | ICD-10-CM | POA: Diagnosis present

## 2013-08-09 DIAGNOSIS — R112 Nausea with vomiting, unspecified: Secondary | ICD-10-CM

## 2013-08-09 DIAGNOSIS — I509 Heart failure, unspecified: Secondary | ICD-10-CM | POA: Diagnosis present

## 2013-08-09 DIAGNOSIS — A088 Other specified intestinal infections: Principal | ICD-10-CM | POA: Diagnosis present

## 2013-08-09 DIAGNOSIS — D649 Anemia, unspecified: Secondary | ICD-10-CM | POA: Diagnosis present

## 2013-08-09 DIAGNOSIS — E039 Hypothyroidism, unspecified: Secondary | ICD-10-CM

## 2013-08-09 DIAGNOSIS — R509 Fever, unspecified: Secondary | ICD-10-CM

## 2013-08-09 DIAGNOSIS — K922 Gastrointestinal hemorrhage, unspecified: Secondary | ICD-10-CM

## 2013-08-09 DIAGNOSIS — G40909 Epilepsy, unspecified, not intractable, without status epilepticus: Secondary | ICD-10-CM

## 2013-08-09 DIAGNOSIS — D62 Acute posthemorrhagic anemia: Secondary | ICD-10-CM

## 2013-08-09 DIAGNOSIS — I482 Chronic atrial fibrillation, unspecified: Secondary | ICD-10-CM | POA: Diagnosis present

## 2013-08-09 DIAGNOSIS — I4891 Unspecified atrial fibrillation: Secondary | ICD-10-CM | POA: Diagnosis present

## 2013-08-09 DIAGNOSIS — Z87891 Personal history of nicotine dependence: Secondary | ICD-10-CM

## 2013-08-09 DIAGNOSIS — I5032 Chronic diastolic (congestive) heart failure: Secondary | ICD-10-CM | POA: Diagnosis present

## 2013-08-09 DIAGNOSIS — C73 Malignant neoplasm of thyroid gland: Secondary | ICD-10-CM | POA: Diagnosis present

## 2013-08-09 DIAGNOSIS — K317 Polyp of stomach and duodenum: Secondary | ICD-10-CM

## 2013-08-09 DIAGNOSIS — M129 Arthropathy, unspecified: Secondary | ICD-10-CM | POA: Diagnosis present

## 2013-08-09 HISTORY — DX: Sleep apnea, unspecified: G47.30

## 2013-08-09 HISTORY — DX: Shortness of breath: R06.02

## 2013-08-09 MED ORDER — SODIUM CHLORIDE 0.9 % IV SOLN
INTRAVENOUS | Status: DC
  Administered 2013-08-10: 10 mL via INTRAVENOUS

## 2013-08-09 NOTE — ED Notes (Addendum)
Pt ate fish today and has been having abdominal "tightness" along with vomiting, denies diarrhea, denies blood in vomit

## 2013-08-09 NOTE — ED Notes (Signed)
MD at bedside. 

## 2013-08-09 NOTE — ED Provider Notes (Signed)
CSN: [id number]     Arrival date & time 08/09/13  2334 History   First MD Initiated Contact with Patient 08/09/13 2345     Chief Complaint  Patient presents with  . Nausea  . Abdominal Pain   (Consider location/radiation/quality/duration/timing/severity/associated sxs/prior Treatment) Patient is a 79 y.o. female presenting with abdominal pain. The history is provided by the patient.  Abdominal Pain  patient here complaining of sudden onset of nausea and vomiting after eating fish this evening. She denies any anginal type chest pain. Her vomiting has been nonbilious and not associated with fever chills. No black or bloody stools. Does have a history of GERD. Denies any dyspnea or aspiration of her emesis. No lower extremity swelling or edema. Take Phenergan home without relief. EMS was called patient given Zofran and her nausea subsided. States that she had probably 4-5 bouts of emesis.  Past Medical History  Diagnosis Date  . Hypertension   . Stroke   . Seizures   . Thyroid disease   . CHF (congestive heart failure)   . GERD (gastroesophageal reflux disease)   . Cancer     throid ca  . Arthritis     knees  . Anemia    Past Surgical History  Procedure Laterality Date  . Abdominal hysterectomy    . Tonsillectomy    . Eye surgery    . Throidectomy    . Knee surgery    . Esophagogastroduodenoscopy  07/03/2012    Procedure: ESOPHAGOGASTRODUODENOSCOPY (EGD);  Surgeon: Willis Modena, MD;  Location: [facility];  Service: Endoscopy;  Laterality: N/A;   No family history on file. History  Substance Use Topics  . Smoking status: Former Smoker    Types: Cigarettes  . Smokeless tobacco: Not on file  . Alcohol Use: No   OB History   Grav Para Term Preterm Abortions TAB SAB Ect Mult Living                 Review of Systems  Gastrointestinal: Positive for abdominal pain.  All other systems reviewed and are negative.    Allergies  Adhesive  Home Medications   Current  Outpatient Rx  Name  Route  Sig  Dispense  Refill  . bisoprolol-hydrochlorothiazide (ZIAC) 5-6.25 MG per tablet   Oral   Take 1 tablet by mouth every morning.         . Cholecalciferol (VITAMIN D3) 5000 UNITS TABS   Oral   Take 5,000 Units by mouth every other day.         . furosemide (LASIX) 40 MG tablet   Oral   Take 40 mg by mouth every morning.         . levETIRAcetam (KEPPRA) 500 MG tablet   Oral   Take 500 mg by mouth 2 (two) times daily.         Marland Kitchen levothyroxine (SYNTHROID, LEVOTHROID) 88 MCG tablet   Oral   Take 88 mcg by mouth every morning.         . Magnesium 400 MG CAPS   Oral   Take 800 mg by mouth 3 (three) times daily.         Marland Kitchen OVER THE COUNTER MEDICATION   Oral   Take 1 tablet by mouth every morning. Slow release iron         . EXPIRED: pantoprazole (PROTONIX) 40 MG tablet   Oral   Take 1 tablet (40 mg total) by mouth 2 (two) times daily before a meal.  60 tablet   2   . polyethylene glycol (MIRALAX / GLYCOLAX) packet   Oral   Take 17 g by mouth daily. Dissolve in water         . potassium chloride SA (K-DUR,KLOR-CON) 20 MEQ tablet   Oral   Take 20 mEq by mouth at bedtime.         Marland Kitchen rOPINIRole (REQUIP) 2 MG tablet   Oral   Take 2 mg by mouth at bedtime.         . rosuvastatin (CRESTOR) 20 MG tablet   Oral   Take 20 mg by mouth every morning.          BP 123/61  Pulse 97  Temp(Src) 99.8 F (37.7 C) (Oral)  Ht 5\' 4"  (1.626 m)  Wt 238 lb (107.956 kg)  BMI 40.83 kg/m2  SpO2 100% Physical Exam  Nursing note and vitals reviewed. Constitutional: She is oriented to person, place, and time. She appears well-developed and well-nourished.  Non-toxic appearance. No distress.  HENT:  Head: Normocephalic and atraumatic.  Eyes: Conjunctivae, EOM and lids are normal. Pupils are equal, round, and reactive to light.  Neck: Normal range of motion. Neck supple. No tracheal deviation present. No mass present.  Cardiovascular:  Normal rate and normal heart sounds.  An irregularly irregular rhythm present. Exam reveals no gallop.   No murmur heard. Pulmonary/Chest: Effort normal and breath sounds normal. No stridor. No respiratory distress. She has no decreased breath sounds. She has no wheezes. She has no rhonchi. She has no rales.  Abdominal: Soft. Normal appearance and bowel sounds are normal. She exhibits no distension. There is no tenderness. There is no rigidity, no rebound, no guarding and no CVA tenderness.  Musculoskeletal: Normal range of motion. She exhibits no edema and no tenderness.  Neurological: She is alert and oriented to person, place, and time. She has normal strength. No cranial nerve deficit or sensory deficit. GCS eye subscore is 4. GCS verbal subscore is 5. GCS motor subscore is 6.  Skin: Skin is warm and dry. No abrasion and no rash noted.  Psychiatric: She has a normal mood and affect. Her speech is normal and behavior is normal.    ED Course  Procedures (including critical care time) Labs Review Labs Reviewed - No data to display Imaging Review No results found.  EKG Interpretation   None       MDM  No diagnosis found.  Date: 08/09/2013  Rate: 92  Rhythm: atrial fibrillation  QRS Axis: normal  Intervals: normal  ST/T Wave abnormalities: nonspecific ST changes  Conduction Disutrbances:none  Narrative Interpretation:   Old EKG Reviewed: unchanged    3:31 AM Patient given additional Zofran here for nausea and no vomiting here. Abdominal ultrasound showed cholelithiasis without cholecystitis. Patient does desaturate when taken off oxygen. We'll check abdominal and chest CTs  6:07 AM Patient's labs and x-rays reviewed. No source of infection at this time. We'll start vancomycin and Zosyn empirically. Discuss with Dr. Julian Reil and patient to be admitted to step down. Patient has had episodes of hypotension have been responsive to fluid. Lactic acid was normal. Temperature was  treated. She is maintaining appropriate.   CRITICAL CARE Performed by: Toy Baker Total critical care time: 60 Critical care time was exclusive of separately billable procedures and treating other patients. Critical care was necessary to treat or prevent imminent or life-threatening deterioration. Critical care was time spent personally by me on the following activities: development of treatment  plan with patient and/or surrogate as well as nursing, discussions with consultants, evaluation of patient's response to treatment, examination of patient, obtaining history from patient or surrogate, ordering and performing treatments and interventions, ordering and review of laboratory studies, ordering and review of radiographic studies, pulse oximetry and re-evaluation of patient's condition.   Toy Baker, MD 08/10/13 [phone]

## 2013-08-10 ENCOUNTER — Encounter (HOSPITAL_COMMUNITY): Payer: Self-pay | Admitting: Emergency Medicine

## 2013-08-10 ENCOUNTER — Emergency Department (HOSPITAL_COMMUNITY): Payer: Medicare Other

## 2013-08-10 DIAGNOSIS — I5032 Chronic diastolic (congestive) heart failure: Secondary | ICD-10-CM

## 2013-08-10 DIAGNOSIS — I509 Heart failure, unspecified: Secondary | ICD-10-CM

## 2013-08-10 DIAGNOSIS — R509 Fever, unspecified: Secondary | ICD-10-CM

## 2013-08-10 DIAGNOSIS — I10 Essential (primary) hypertension: Secondary | ICD-10-CM

## 2013-08-10 DIAGNOSIS — I369 Nonrheumatic tricuspid valve disorder, unspecified: Secondary | ICD-10-CM

## 2013-08-10 DIAGNOSIS — R112 Nausea with vomiting, unspecified: Secondary | ICD-10-CM

## 2013-08-10 LAB — CBC WITH DIFFERENTIAL/PLATELET
Basophils Absolute: 0 10*3/uL (ref 0.0–0.1)
Basophils Relative: 0 % (ref 0–1)
Eosinophils Absolute: 0.1 10*3/uL (ref 0.0–0.7)
MCH: 31.4 pg (ref 26.0–34.0)
MCHC: 35 g/dL (ref 30.0–36.0)
Monocytes Relative: 3 % (ref 3–12)
Neutro Abs: 16 10*3/uL — ABNORMAL HIGH (ref 1.7–7.7)
Neutrophils Relative %: 94 % — ABNORMAL HIGH (ref 43–77)
Platelets: 209 10*3/uL (ref 150–400)
RDW: 13.6 % (ref 11.5–15.5)

## 2013-08-10 LAB — URINALYSIS, ROUTINE W REFLEX MICROSCOPIC
Bilirubin Urine: NEGATIVE
Glucose, UA: NEGATIVE mg/dL
Hgb urine dipstick: NEGATIVE
Ketones, ur: NEGATIVE mg/dL
Leukocytes, UA: NEGATIVE
Protein, ur: 30 mg/dL — AB
Urobilinogen, UA: 0.2 mg/dL (ref 0.0–1.0)

## 2013-08-10 LAB — URINE MICROSCOPIC-ADD ON

## 2013-08-10 LAB — COMPREHENSIVE METABOLIC PANEL
AST: 22 U/L (ref 0–37)
Albumin: 3.6 g/dL (ref 3.5–5.2)
Alkaline Phosphatase: 74 U/L (ref 39–117)
BUN: 17 mg/dL (ref 6–23)
Chloride: 95 mEq/L — ABNORMAL LOW (ref 96–112)
Potassium: 3.7 mEq/L (ref 3.5–5.1)
Total Bilirubin: 0.4 mg/dL (ref 0.3–1.2)
Total Protein: 7.2 g/dL (ref 6.0–8.3)

## 2013-08-10 LAB — HEMOGLOBIN A1C
Hgb A1c MFr Bld: 6.3 % — ABNORMAL HIGH (ref <5.7)
Mean Plasma Glucose: 134 mg/dL — ABNORMAL HIGH (ref <117)

## 2013-08-10 LAB — LIPASE, BLOOD: Lipase: 19 U/L (ref 11–59)

## 2013-08-10 LAB — TROPONIN I: Troponin I: <0.3 ng/mL (ref <0.30)

## 2013-08-10 MED ORDER — PANTOPRAZOLE SODIUM 40 MG IV SOLR
40.0000 mg | Freq: Two times a day (BID) | INTRAVENOUS | Status: DC
  Administered 2013-08-10 (×2): 40 mg via INTRAVENOUS
  Filled 2013-08-10 (×4): qty 40

## 2013-08-10 MED ORDER — SODIUM CHLORIDE 0.9 % IV SOLN: INTRAVENOUS | Status: DC

## 2013-08-10 MED ORDER — PROMETHAZINE HCL 25 MG/ML IJ SOLN
25.0000 mg | Freq: Four times a day (QID) | INTRAMUSCULAR | Status: DC | PRN
  Filled 2013-08-10: qty 1

## 2013-08-10 MED ORDER — IOHEXOL 350 MG/ML SOLN
100.0000 mL | Freq: Once | INTRAVENOUS | Status: AC | PRN
  Administered 2013-08-10: 100 mL via INTRAVENOUS

## 2013-08-10 MED ORDER — SODIUM CHLORIDE 0.9 % IJ SOLN
3.0000 mL | Freq: Two times a day (BID) | INTRAMUSCULAR | Status: DC
  Administered 2013-08-10: 3 mL via INTRAVENOUS

## 2013-08-10 MED ORDER — SODIUM CHLORIDE 0.9 % IV BOLUS (SEPSIS)
500.0000 mL | Freq: Once | INTRAVENOUS | Status: AC
  Administered 2013-08-10: 500 mL via INTRAVENOUS

## 2013-08-10 MED ORDER — ATORVASTATIN CALCIUM 20 MG PO TABS
20.0000 mg | ORAL_TABLET | Freq: Every day | ORAL | Status: DC
  Administered 2013-08-10: 20 mg via ORAL
  Filled 2013-08-10 (×2): qty 1

## 2013-08-10 MED ORDER — ONDANSETRON HCL 4 MG PO TABS: 8.0000 mg | ORAL_TABLET | Freq: Four times a day (QID) | ORAL | Status: DC | PRN

## 2013-08-10 MED ORDER — LEVETIRACETAM 500 MG PO TABS
500.0000 mg | ORAL_TABLET | Freq: Two times a day (BID) | ORAL | Status: DC
  Administered 2013-08-10 – 2013-08-11 (×3): 500 mg via ORAL
  Filled 2013-08-10 (×4): qty 1

## 2013-08-10 MED ORDER — ONDANSETRON HCL 4 MG/2ML IJ SOLN
4.0000 mg | Freq: Once | INTRAMUSCULAR | Status: AC
  Administered 2013-08-10: 4 mg via INTRAVENOUS
  Filled 2013-08-10: qty 2

## 2013-08-10 MED ORDER — METOCLOPRAMIDE HCL 5 MG/ML IJ SOLN
5.0000 mg | Freq: Once | INTRAMUSCULAR | Status: AC
  Administered 2013-08-10: 5 mg via INTRAVENOUS
  Filled 2013-08-10: qty 2

## 2013-08-10 MED ORDER — PROMETHAZINE HCL 25 MG RE SUPP: 25.0000 mg | Freq: Four times a day (QID) | RECTAL | Status: DC | PRN

## 2013-08-10 MED ORDER — ACETAMINOPHEN 650 MG RE SUPP: 650.0000 mg | Freq: Four times a day (QID) | RECTAL | Status: DC | PRN

## 2013-08-10 MED ORDER — PROMETHAZINE HCL 25 MG/ML IJ SOLN: 25.0000 mg | Freq: Four times a day (QID) | INTRAMUSCULAR | Status: DC | PRN

## 2013-08-10 MED ORDER — SODIUM CHLORIDE 0.9 % IV BOLUS (SEPSIS): 1000.0000 mL | Freq: Once | INTRAVENOUS | Status: DC

## 2013-08-10 MED ORDER — PROMETHAZINE HCL 25 MG PO TABS: 25.0000 mg | ORAL_TABLET | Freq: Four times a day (QID) | ORAL | Status: DC | PRN

## 2013-08-10 MED ORDER — ONDANSETRON 8 MG/NS 50 ML IVPB
8.0000 mg | Freq: Four times a day (QID) | INTRAVENOUS | Status: DC | PRN
  Filled 2013-08-10: qty 8

## 2013-08-10 MED ORDER — PIPERACILLIN-TAZOBACTAM 3.375 G IVPB
3.3750 g | Freq: Once | INTRAVENOUS | Status: AC
  Administered 2013-08-10: 3.375 g via INTRAVENOUS
  Filled 2013-08-10: qty 50

## 2013-08-10 MED ORDER — IOHEXOL 300 MG/ML  SOLN: 25.0000 mL | Freq: Once | INTRAMUSCULAR | Status: DC | PRN

## 2013-08-10 MED ORDER — LEVOTHYROXINE SODIUM 88 MCG PO TABS
88.0000 ug | ORAL_TABLET | Freq: Every day | ORAL | Status: DC
  Administered 2013-08-10 – 2013-08-11 (×2): 88 ug via ORAL
  Filled 2013-08-10 (×3): qty 1

## 2013-08-10 MED ORDER — ROPINIROLE HCL 1 MG PO TABS
2.0000 mg | ORAL_TABLET | Freq: Once | ORAL | Status: AC
  Administered 2013-08-10: 2 mg via ORAL
  Filled 2013-08-10: qty 2

## 2013-08-10 MED ORDER — METOCLOPRAMIDE HCL 5 MG/ML IJ SOLN: 10.0000 mg | Freq: Once | INTRAMUSCULAR | Status: DC

## 2013-08-10 MED ORDER — ALUM & MAG HYDROXIDE-SIMETH 200-200-20 MG/5ML PO SUSP: 30.0000 mL | Freq: Four times a day (QID) | ORAL | Status: DC | PRN

## 2013-08-10 MED ORDER — ACETAMINOPHEN 325 MG PO TABS
650.0000 mg | ORAL_TABLET | Freq: Four times a day (QID) | ORAL | Status: DC | PRN
  Administered 2013-08-10: 650 mg via ORAL
  Filled 2013-08-10: qty 2

## 2013-08-10 MED ORDER — BISOPROLOL FUMARATE 5 MG PO TABS
5.0000 mg | ORAL_TABLET | Freq: Every day | ORAL | Status: DC
  Administered 2013-08-10 – 2013-08-11 (×2): 5 mg via ORAL
  Filled 2013-08-10 (×2): qty 1

## 2013-08-10 MED ORDER — VANCOMYCIN HCL IN DEXTROSE 1-5 GM/200ML-% IV SOLN: 1000.0000 mg | Freq: Once | INTRAVENOUS | Status: DC

## 2013-08-10 MED ORDER — APIXABAN 5 MG PO TABS
5.0000 mg | ORAL_TABLET | Freq: Two times a day (BID) | ORAL | Status: DC
  Administered 2013-08-10 – 2013-08-11 (×3): 5 mg via ORAL
  Filled 2013-08-10 (×4): qty 1

## 2013-08-10 MED ORDER — ROPINIROLE HCL 1 MG PO TABS
2.0000 mg | ORAL_TABLET | Freq: Every day | ORAL | Status: DC
  Administered 2013-08-10: 2 mg via ORAL
  Filled 2013-08-10 (×2): qty 2

## 2013-08-10 NOTE — Progress Notes (Signed)
Pt admitted from ED with c/o nausea, since arrival to unit pt has not had any c/o nausea, pt on IV fluids, no c/o pain, will continue to monitor

## 2013-08-10 NOTE — Progress Notes (Signed)
Admitted to 3S15.  No complaints.  Oriented X4, Pleasant and cooperative. Lungs decreased through out. Voiding clear yellow urine. Family at bedside.  Aware of transfer to 4E 20.

## 2013-08-10 NOTE — ED Notes (Signed)
Pt. Bolus started per sepsis protocol. Pt. Hx of CHF so fluid bolus scaled.

## 2013-08-10 NOTE — ED Notes (Signed)
Attempted to call report, was told I would be called back

## 2013-08-10 NOTE — ED Notes (Signed)
Pt returned from radiology.

## 2013-08-10 NOTE — Evaluation (Signed)
Physical Therapy Evaluation Patient Details Name: [name] MRN: [id number] DOB: [birth year] Today's Date: 08/10/2013 Time: 4098-1191 PT Time Calculation (min): 22 min  PT Assessment / Plan / Recommendation History of Present Illness  Past medical history of epigastric polyp removal in 2013 by EGD, atrial fibrillation on Eloquis, and hypertension that comes in for nausea and vomiting this started 2 hours prior to admission. She relates it has been unremitting, no blood, no bright red blood per rectum or black stools. She had a mild fever here in the emergency room. She relates no abdominal pain. No chest pain or shortness of breath. No travel history. No changes in her medications, no new medications. She took Phenergan at home with no relief so she decided to call EMS. She relates no shortness of breath or chest pain.  Clinical Impression  Pt admitted with the above. Pt currently with functional limitations due to the deficits listed below (see PT Problem List). Pt moving well however continues to be limited due to endurance.  Highly recommend using RW next session however pt refused on evaluation.  Pt will benefit from skilled PT to increase their independence and safety with mobility to allow discharge to the venue listed below.      PT Assessment  Patient needs continued PT services    Follow Up Recommendations  Home health PT;Supervision - Intermittent    Equipment Recommendations  None recommended by PT    Frequency Min 3X/week    Precautions / Restrictions Precautions Precautions: Fall (husband reports 2 falls)   Pertinent Vitals/Pain No c/o pain; VSS      Mobility  Bed Mobility Bed Mobility: Supine to Sit;Sit to Supine Supine to Sit: 4: Min assist;HOB elevated;With rails Sit to Supine: 4: Min assist Details for Bed Mobility Assistance: (A) to elevate trunk OOB and (A) with LE into bed Transfers Transfers: Sit to Stand;Stand to Sit Sit to Stand: 4: Min  guard;From bed Stand to Sit: 4: Min guard;To bed Details for Transfer Assistance: Minguard for safety Ambulation/Gait Ambulation/Gait Assistance: 4: Min guard Ambulation Distance (Feet): 100 Feet Assistive device: 1 person hand held assist (IV pole) Ambulation/Gait Assistance Details: Mingaurd for safey with HHA to simulate SPC.  Pt will benefit from use of RW next session. Educated pt on using RW however refused during evaluation.  Gait Pattern: Step-through pattern;Shuffle;Trunk flexed Gait velocity: decreased Stairs: No    Exercises     PT Diagnosis: Difficulty walking;Generalized weakness  PT Problem List: Decreased strength;Decreased activity tolerance;Decreased balance;Decreased mobility;Decreased knowledge of use of DME PT Treatment Interventions: DME instruction;Gait training;Stair training;Functional mobility training;Therapeutic activities;Therapeutic exercise;Balance training;Patient/family education     PT Goals(Current goals can be found in the care plan section) Acute Rehab PT Goals Patient Stated Goal: To go home PT Goal Formulation: With patient/family Time For Goal Achievement: 08/17/13 Potential to Achieve Goals: Good  Visit Information  Last PT Received On: 08/10/13 Assistance Needed: +1 History of Present Illness: Past medical history of epigastric polyp removal in 2013 by EGD, atrial fibrillation on Eloquis, and hypertension that comes in for nausea and vomiting this started 2 hours prior to admission. She relates it has been unremitting, no blood, no bright red blood per rectum or black stools. She had a mild fever here in the emergency room. She relates no abdominal pain. No chest pain or shortness of breath. No travel history. No changes in her medications, no new medications. She took Phenergan at home with no relief so she decided to call EMS.  She relates no shortness of breath or chest pain.       Prior Functioning  Home Living Family/patient expects to  be discharged to:: Private residence Living Arrangements: Spouse/significant other Available Help at Discharge: Family Type of Home: House Home Access: Stairs to enter Secretary/administrator of Steps: 3 Entrance Stairs-Rails: Left Home Layout: One level Home Equipment: Walker - 2 wheels;Wheelchair - manual Prior Function Level of Independence: Independent with assistive device(s) (occasional use of RW) Communication Communication: No difficulties Dominant Hand: Right    Cognition  Cognition Arousal/Alertness: Awake/alert Behavior During Therapy: WFL for tasks assessed/performed Overall Cognitive Status: Within Functional Limits for tasks assessed    Extremity/Trunk Assessment Lower Extremity Assessment Lower Extremity Assessment: Generalized weakness   Balance    End of Session PT - End of Session Equipment Utilized During Treatment: Gait belt Activity Tolerance: Patient tolerated treatment well Patient left: in bed;with call bell/phone within reach Nurse Communication: Mobility status  GP     Vikas Wegmann 08/10/2013, 5:46 PM  Jake Shark, PT DPT [phone]

## 2013-08-10 NOTE — Progress Notes (Signed)
Utilization Review Completed.   Enedina Pair, RN, BSN Nurse Case Manager  [phone]  

## 2013-08-10 NOTE — ED Notes (Signed)
Pt returned from radiology, put back on monitor

## 2013-08-10 NOTE — ED Notes (Signed)
Per Dr. Freida Busman, calling code sepsis on patient.

## 2013-08-10 NOTE — H&P (Signed)
[facility] History and Physical  [name] ZOX:[id number] DOB: [birth year] DOA: 08/09/2013  Referring physician: Dr. Freida Busman PCP: [name], MD  Specialists: none  Chief Complaint: nausea and vomiting  HPI: [name] is a 79 y.o. female  Past medical history of epigastric polyp removal in 2013 by EGD, atrial fibrillation on Eloquis, and hypertension that comes in for nausea and vomiting this started 2 hours prior to admission. She relates it has been unremitting, no blood, no bright red blood per rectum or black stools. She had a mild fever here in the emergency room. She relates no abdominal pain. No chest pain or shortness of breath. No travel history. No changes in her medications, no new medications. She took Phenergan at home with no relief so she decided to call EMS. She relates no shortness of breath or chest pain.  In the ED: She had a mild temperature of 101, her blood pressure was 100. She was started on IV fluids given Phenergan and Zofran with some improvement of the nausea. CT scan of the chest abdomen and pelvis showed no acute abnormalities. CBC was done that shows a mild leukocytosis, basic metabolic panel was done that showed mild hypochloremia, lactic acid was 1.3. UA is negative for any infections, blood cultures were drawn chest x-ray does not show any infiltrates.  Review of Systems: The patient denies anorexia,  weight loss,, vision loss, decreased hearing, hoarseness, chest pain, syncope, dyspnea on exertion, peripheral edema, balance deficits, hemoptysis, abdominal pain, melena, hematochezia, hematuria, incontinence, genital sores, muscle weakness, suspicious skin lesions, transient blindness, difficulty walking, depression, unusual weight change, abnormal bleeding, enlarged lymph nodes, angioedema, and breast masses.    Past Medical History  Diagnosis Date  . Hypertension   . Stroke   . Seizures   . Thyroid disease   . GERD  (gastroesophageal reflux disease)   . Arthritis     knees  . Anemia   . Cancer     throid ca  . CHF (congestive heart failure)    Past Surgical History  Procedure Laterality Date  . Abdominal hysterectomy    . Tonsillectomy    . Eye surgery    . Throidectomy    . Knee surgery    . Esophagogastroduodenoscopy  07/03/2012    Procedure: ESOPHAGOGASTRODUODENOSCOPY (EGD);  Surgeon: Willis Modena, MD;  Location: [facility];  Service: Endoscopy;  Laterality: N/A;   Social History:  reports that she has quit smoking. Her smoking use included Cigarettes. She smoked 0.00 packs per day. She does not have any smokeless tobacco history on file. She reports that she does not drink alcohol or use illicit drugs. Lives at home with husband  Allergies  Allergen Reactions  . Lyrica [Pregabalin] Other (See Comments)  . Adhesive [Tape] Rash    Family History  Problem Relation Age of Onset  . Heart attack Mother   . Heart attack Father      Prior to Admission medications   Medication Sig Start Date End Date Taking? Authorizing Provider  apixaban (ELIQUIS) 5 MG TABS tablet Take 5 mg by mouth 2 (two) times daily.   Yes Historical Provider, MD  bisoprolol-hydrochlorothiazide (ZIAC) 5-6.25 MG per tablet Take 1 tablet by mouth every morning.   Yes Historical Provider, MD  Cholecalciferol (VITAMIN D3) 5000 UNITS TABS Take 5,000 Units by mouth every other day.   Yes Historical Provider, MD  furosemide (LASIX) 40 MG tablet Take 40 mg by mouth every morning.   Yes Historical Provider,  MD  levETIRAcetam (KEPPRA) 500 MG tablet Take 500 mg by mouth 2 (two) times daily.   Yes Historical Provider, MD  levothyroxine (SYNTHROID, LEVOTHROID) 88 MCG tablet Take 88 mcg by mouth every morning.   Yes Historical Provider, MD  Magnesium 400 MG CAPS Take 800 mg by mouth 3 (three) times daily.   Yes Historical Provider, MD  OVER THE COUNTER MEDICATION Take 1 tablet by mouth every morning. Slow release iron   Yes  Historical Provider, MD  pantoprazole (PROTONIX) 40 MG tablet Take 40 mg by mouth 2 (two) times daily before a meal. 07/05/12  Yes Richarda Overlie, MD  pantoprazole (PROTONIX) 40 MG tablet Take 40 mg by mouth 2 (two) times daily before a meal.   Yes Historical Provider, MD  polyethylene glycol (MIRALAX / GLYCOLAX) packet Take 17 g by mouth daily. Dissolve in water   Yes Historical Provider, MD  potassium chloride SA (K-DUR,KLOR-CON) 20 MEQ tablet Take 20 mEq by mouth at bedtime.   Yes Historical Provider, MD  rOPINIRole (REQUIP) 2 MG tablet Take 2 mg by mouth at bedtime.   Yes Historical Provider, MD  rosuvastatin (CRESTOR) 20 MG tablet Take 20 mg by mouth every morning.   Yes Historical Provider, MD   Physical Exam: Filed Vitals:   08/10/13 0700  BP: 97/74  Pulse: 95  Temp:   Resp: 25   BP 97/74  Pulse 95  Temp(Src) 101.6 F (38.7 C) (Rectal)  Resp 25  Ht 5\' 4"  (1.626 m)  Wt 107.956 kg (238 lb)  BMI 40.83 kg/m2  SpO2 98%  General Appearance:    Alert, cooperative, no distress, appears stated age, obese   Head:    Normocephalic, without obvious abnormality, atraumatic           Throat:   Lips, mucosa, and tongue are dry   Neck:   Supple, symmetrical, trachea midline, no adenopathy;    thyroid:  no JVD     Lungs:     Clear to auscultation bilaterally, respirations unlabored  Chest Wall:    No tenderness or deformity   Heart:    Regular rate and rhythm, S1 and S2 normal, no murmur, rub   or gallop     Abdomen:     Soft, non-tender, bowel sounds active all four quadrants,    no masses, no organomegaly, Murphy sign negative         Extremities:   Extremities normal, atraumatic, no cyanosis or edema  Pulses:   2+ and symmetric all extremities  Skin:   multiple bruises on her skin no rashes or lesions      Neurologic:   CNII-XII intact, normal strength, sensation and reflexes    throughout     Labs on Admission:  Basic Metabolic Panel:  Recent Labs Lab 08/10/13 0013  NA  135  K 3.7  CL 95*  CO2 25  GLUCOSE 149*  BUN 17  CREATININE 1.00  CALCIUM 8.9   Liver Function Tests:  Recent Labs Lab 08/10/13 0013  AST 22  ALT 11  ALKPHOS 74  BILITOT 0.4  PROT 7.2  ALBUMIN 3.6    Recent Labs Lab 08/10/13 0013  LIPASE 19   No results found for this basename: AMMONIA,  in the last 168 hours CBC:  Recent Labs Lab 08/10/13 0013  WBC 17.0*  NEUTROABS 16.0*  HGB 15.6*  HCT 44.6  MCV 89.7  PLT 209   Cardiac Enzymes:  Recent Labs Lab 08/10/13 0013  TROPONINI <0.30  BNP (last 3 results) No results found for this basename: PROBNP,  in the last 8760 hours CBG: No results found for this basename: GLUCAP,  in the last 168 hours  Radiological Exams on Admission: Ct Angio Chest Pe W/cm &/or Wo Cm  08/10/2013   CLINICAL DATA:  Chest pain. Right-sided abdominal pain.  EXAM: CT ANGIOGRAPHY CHEST  CT ABDOMEN AND PELVIS WITH CONTRAST  TECHNIQUE: Multidetector CT imaging of the chest was performed using the standard protocol during bolus administration of intravenous contrast. Multiplanar CT image reconstructions including MIPs were obtained to evaluate the vascular anatomy. Multidetector CT imaging of the abdomen and pelvis was performed using the standard protocol during bolus administration of intravenous contrast.  CONTRAST:  OMNIPAQUE IOHEXOL 350 MG/ML SOLN  COMPARISON:  Abdominal CT 07/24/2008  FINDINGS: CTA CHEST FINDINGS  THORACIC INLET/BODY WALL:  Small or resected right lobe thyroid gland.  No acute abnormality.  MEDIASTINUM:  Cardiomegaly, with notable enlargement of the right heart and borderline flattening of the interventricular septum. Extensive coronary artery atherosclerosis. No significant pericardial effusion. No acute vascular abnormality, including pulmonary embolism. Borderline enlarged lower paratracheal lymph nodes, likely incidental.  LUNG WINDOWS:  Opacity in the lower segment lingula has volume loss, compatible with  atelectasis. No suspected consolidation or edema. Increased density of the lungs related to expiratory phase imaging. No effusion or pneumothorax. No calcified pleural plaques. The  OSSEOUS:  No acute fracture.  No suspicious lytic or blastic lesions.  CT ABDOMEN and PELVIS FINDINGS  ABDOMEN/PELVIS:  Liver: No focal abnormality.  Biliary: Cholelithiasis. Gallbladder distention but no gallbladder inflammatory change. Calcification on the under surface of the gallbladder is likely peritoneal given the other peritoneal findings, and the lack of gallbladder wall thickening. Attention on followup.  Pancreas: Unremarkable.  Spleen: Irregular shape, likely from previous trauma or infarct.  Adrenals: Unremarkable.  Kidneys and ureters: Bilateral low dense renal lesions, the largest of enough to measure are water density, consistent with cysts. The largest on the right is 4.4 cm from the lower pole. The largest on the left is exophytic from the interpolar region, a 4.2 cm.  Bladder: Unremarkable.  Reproductive: Hysterectomy.  Bowel: Extensive colonic diverticulosis. No diverticulitis or colitis. No appendix seen.  Retroperitoneum: No mass or adenopathy.  Peritoneum: There is diffuse micro nodularity of the greater omentum with linear calcifications. The pericolic gutters likewise are thickened with plaque-like calcifications. There is mild thickening of the interloop peritoneal lining. No primary malignancy seen. No significant ascites.  Vascular: No acute abnormality.  OSSEOUS: No acute abnormalities. No suspicious lytic or blastic lesions.  Review of the MIP images confirms the above findings.  IMPRESSION: CTA CHEST IMPRESSION  1. Negative for pulmonary embolism. 2. Lingular atelectasis. 3. No pleural plaques.  CT ABDOMEN and PELVIS IMPRESSION  1. Calcifying peritoneal disease which has slowly progressed since 2009. The morphology and slow progression favors an infectious or inflammatory process. Although less likely, an  indolent neoplasm remains in the differential. If no diagnostic sampling pursued, followup CT imaging recommended in 3-6 months. 2. Cholelithiasis. 3. Colonic diverticulosis.   Electronically Signed   By: Tiburcio Pea M.D.   On: 08/10/2013 05:35   US Abdomen Complete  08/10/2013   CLINICAL DATA:  Abdominal pain.  EXAM: ULTRASOUND ABDOMEN COMPLETE  COMPARISON:  None.  FINDINGS: Gallbladder  Numerous shadowing gallstones. No sonographic Murphy sign or wall thickening.  Common bile duct  Diameter: 6 mm.  Liver  Diffuse increased echogenicity. No focal abnormality.  IVC  Not clearly seen.  Pancreas  Visualized portion unremarkable.  Spleen  Upper limits of normal size at 13 cm. No focal abnormality.  Right Kidney  Length: 13 cm. There are 2 simple appearing cystic lesions, 5 cm from the lower pole and 4.5 cm from the upper pole. No hydronephrosis.  Left Kidney  Length: 12 cm. Simple appearing cyst exophytic from the interpolar region, 4.5 cm maximal dimension. No hydronephrosis.  Abdominal aorta  No aneurysm visualized. Maximal diameter 2 cm.  IMPRESSION: 1. Cholelithiasis. No evidence of acute cholecystitis. 2. Hepatic steatosis. 3. Bilateral renal cysts.   Electronically Signed   By: Tiburcio Pea M.D.   On: 08/10/2013 03:08   Dg Abd Acute W/chest  08/10/2013   CLINICAL DATA:  Emesis. Pain.  EXAM: ACUTE ABDOMEN SERIES (ABDOMEN 2 VIEW & CHEST 1 VIEW)  COMPARISON:  01/11/2013 radiography of the chest  FINDINGS: Low volume lungs. Interstitial opacity at the right base is likely atelectasis given it partly clears with right lateral decubitus positioning. Cardiomegaly without overt edema.  No evidence of dilated bowel. Underpenetration and motion decreases sensitivity for detecting other intra-abdominal pathology. Gallstones.  No acute osseous findings.  IMPRESSION: 1. No evidence of bowel obstruction. 2. Cholelithiasis. 3. Low lung volumes with basilar atelectasis.   Electronically Signed   By: Tiburcio Pea M.D.   On: 08/10/2013 01:11    EKG: Independently reviewed: A. fib, with normal axis no ST segment changes unchanged from previous  Assessment/Plan Nausea and vomiting in adult: - Unclear etiology, she has a white count of 17 with a mild temperature of 101.6, her lactic acid is less than 2, CT scan of the chest,  abdomen and pelvis Showed Calcifying peritoneal disease and cholelithiasis, ultrasound of the abdomen show no evidence of acute cholecystitis. It showed no infiltrate in her lungs, she does not relate any cough or shortness of breath. She relates no night sweats. There is no vaginal discharge burning when she urinates her UA does not appear to have any infectious etiology. She is mentating well relates no blurred vision or headaches which makes viral or bacterial meningitis unlikely. Question of cardiac enzymes negative x1 EKG showed no significant changes compared to previous. Check a 2-D echo continue cycle cardiac enzymes.  - Hold all on vancomycin and Zosyn , continued conservative management with IV fluids Reglan Zofran and Phenergan for nausea. Recheck a basic metabolic panel and a CBC tomorrow morning. I will place her n.p.o. - Check orthostatic urinary sodium and urinary creatinine. Hold her blood pressure medications except for her beta blocker.   A-fib: - Rate control continue present, and anticoagulation. No signs of overt bleeding.  Diastolic CHF, chronic/HTN (hypertension): - Seems to be compensated actually dry hold diuretics. - Hold ACE inhibitor start her on gentle IV fluids. Check a basic metabolic panel in the morning. - Check orthostatics, urinary creatinine and sodium. - Continue strict I.'s and O.'s and daily weights.     Code Status: full Family Communication: husband Disposition Plan: inpatient  Time spent: 80 minutes  Marinda Elk [facility] Pager [phone]  If 7PM-7AM, please contact night-coverage [email or web address] Password  [facility] 08/10/2013, 8:27 AM

## 2013-08-10 NOTE — ED Notes (Signed)
Pt. C/o increased difficulty breathing and nausea. Dr. Freida Busman informed

## 2013-08-10 NOTE — ED Notes (Signed)
Spoke with EDP about antibiotics, no order for antibiotics at this time

## 2013-08-10 NOTE — ED Notes (Signed)
Pt c/o mild nausea and feels like she might throw up, MD notified,

## 2013-08-10 NOTE — ED Notes (Signed)
Pt. O2 sat in upper 80s. Pt. Denies SOB but breathing appears heavy and labored. Placed on 2L oxygen, O2 sat raised to 100%

## 2013-08-10 NOTE — Progress Notes (Signed)
  Echocardiogram 2D Echocardiogram has been performed.  [name] 08/10/2013, 3:04 PM

## 2013-08-11 LAB — URINE CULTURE: Culture: NO GROWTH

## 2013-08-11 LAB — COMPREHENSIVE METABOLIC PANEL
AST: 16 U/L (ref 0–37)
Albumin: 3.1 g/dL — ABNORMAL LOW (ref 3.5–5.2)
Alkaline Phosphatase: 61 U/L (ref 39–117)
BUN: 14 mg/dL (ref 6–23)
CO2: 25 mEq/L (ref 19–32)
Calcium: 8.4 mg/dL (ref 8.4–10.5)
Chloride: 104 mEq/L (ref 96–112)
Creatinine, Ser: 1.03 mg/dL (ref 0.50–1.10)
Glucose, Bld: 94 mg/dL (ref 70–99)
Potassium: 3.6 mEq/L (ref 3.5–5.1)
Sodium: 142 mEq/L (ref 135–145)
Total Protein: 6.2 g/dL (ref 6.0–8.3)

## 2013-08-11 LAB — CBC
Hemoglobin: 12.1 g/dL (ref 12.0–15.0)
MCV: 91.8 fL (ref 78.0–100.0)
RBC: 4.04 MIL/uL (ref 3.87–5.11)

## 2013-08-11 MED ORDER — BISOPROLOL FUMARATE 5 MG PO TABS: 5.0000 mg | ORAL_TABLET | Freq: Every day | ORAL | Status: DC

## 2013-08-11 MED ORDER — PANTOPRAZOLE SODIUM 40 MG PO TBEC
40.0000 mg | DELAYED_RELEASE_TABLET | Freq: Two times a day (BID) | ORAL | Status: DC
  Administered 2013-08-11: 40 mg via ORAL
  Filled 2013-08-11: qty 1

## 2013-08-11 MED ORDER — ONDANSETRON 8 MG PO TBDP: 8.0000 mg | ORAL_TABLET | Freq: Three times a day (TID) | ORAL | Status: AC | PRN

## 2013-08-11 NOTE — Progress Notes (Signed)
Pt being dc to home with husband, pt given dc instructions, follow up appointments and medications reviewed, pt and husband verbalized understanding, pt left via wheelchair with family, pt stable

## 2013-08-11 NOTE — Discharge Summary (Signed)
Physician Discharge Summary  [name]:[id number] DOB: 10-09-1931 DOA: 08/09/2013  PCP: Breanna Corwin, MD  Admit date: 08/09/2013 Discharge date: 08/11/2013  Time spent: 35 minutes  Recommendations for Outpatient Follow-up:  1. Follow up with PCP in 1 week.   Discharge Diagnoses:  Active Problems:   Nausea and vomiting in adult   A-fib   Diastolic CHF, chronic   HTN (hypertension)   Discharge Condition: stable  Diet recommendation: heart healthy diet  Filed Weights   08/09/13 2343 08/10/13 1233  Weight: 107.956 kg (238 lb) 105.9 kg (233 lb 7.5 oz)    History of present illness:  79 y.o. female  Past medical history of epigastric polyp removal in 2013 by EGD, atrial fibrillation on Eloquis, and hypertension that comes in for nausea and vomiting this started 2 hours prior to admission. She relates it has been unremitting, no blood, no bright red blood per rectum or black stools. She had a mild fever here in the emergency room. She relates no abdominal pain. No chest pain or shortness of breath. No travel history. No changes in her medications, no new medications. She took Phenergan at home with no relief so she decided to call EMS. She relates no shortness of breath or chest pain.   Hospital Course:  Nausea and vomiting in adult:  - Unclear etiology, most likely due to viral gastroenteritis. - She had a white count of 17 with a mild temperature of 101.6, her lactic acid is less than 2, CT scan of the chest, abdomen and pelvis Showed Calcifying peritoneal disease and cholelithiasis, ultrasound of the abdomen show no evidence of acute cholecystitis. It showed no infiltrate in her lungs,  - There is no vaginal discharge, no burning when she urinates her UA does not appear to have any infectious etiology. -  She is mentating well relates no blurred vision or headaches which makes viral or bacterial meningitis unlikely.  - Cardiac enzymes negative x3 EKG showed no  significant changes compared to previous. Check a 2-D echo no wall motion abnormality. - Continued conservative management with IV fluids Reglan Zofran and Phenergan for nausea.  - by that night she was eating. No events on telemetry. - Orthostatic were negative. - d/c HCTZ cont bisoprolol and lasix.  Procedures:  Echo  Consultations:  none  Discharge Exam: Filed Vitals:   08/10/13 2231  BP: 106/53  Pulse: 94  Temp: 98.1 F (36.7 C)  Resp: 20    General: A&O x3 Cardiovascular: RRR Respiratory: good air movement CTA B/L  Discharge Instructions      Discharge Orders   Future Appointments Provider Department Dept Phone   08/30/2013 1:30 PM [facility] [place] Office [phone]   01/24/2014 11:15 AM Lesleigh Noe, MD [facility] [phone]   Future Orders Complete By Expires   Diet - low sodium heart healthy  As directed    Increase activity slowly  As directed        Medication List    STOP taking these medications       Vitamin D3 5000 UNITS Tabs      TAKE these medications       bisoprolol 5 MG tablet  Commonly known as:  ZEBETA  Take 1 tablet (5 mg total) by mouth daily.     bisoprolol-hydrochlorothiazide 5-6.25 MG per tablet  Commonly known as:  ZIAC  Take 1 tablet by mouth every morning.     ELIQUIS 5 MG  Tabs tablet  Generic drug:  apixaban  Take 5 mg by mouth 2 (two) times daily.     furosemide 40 MG tablet  Commonly known as:  LASIX  Take 40 mg by mouth every morning.     levETIRAcetam 500 MG tablet  Commonly known as:  KEPPRA  Take 500 mg by mouth 2 (two) times daily.     levothyroxine 88 MCG tablet  Commonly known as:  SYNTHROID, LEVOTHROID  Take 88 mcg by mouth every morning.     Magnesium 400 MG Caps  Take 800 mg by mouth 3 (three) times daily.     ondansetron 8 MG disintegrating tablet  Commonly known as:  ZOFRAN-ODT  Take 1 tablet (8 mg total) by mouth every 8 (eight) hours  as needed for nausea.     OVER THE COUNTER MEDICATION  Take 1 tablet by mouth every morning. Slow release iron     pantoprazole 40 MG tablet  Commonly known as:  PROTONIX  Take 40 mg by mouth 2 (two) times daily before a meal.     pantoprazole 40 MG tablet  Commonly known as:  PROTONIX  Take 40 mg by mouth 2 (two) times daily before a meal.     polyethylene glycol packet  Commonly known as:  MIRALAX / GLYCOLAX  Take 17 g by mouth daily. Dissolve in water     potassium chloride SA 20 MEQ tablet  Commonly known as:  K-DUR,KLOR-CON  Take 20 mEq by mouth at bedtime.     rOPINIRole 2 MG tablet  Commonly known as:  REQUIP  Take 2 mg by mouth at bedtime.     rosuvastatin 20 MG tablet  Commonly known as:  CRESTOR  Take 20 mg by mouth every morning.       Allergies  Allergen Reactions  . Lyrica [Pregabalin] Other (See Comments)  . Adhesive [Tape] Rash   Follow-up Information   Follow up with MCKEOWN,WILLIAM DAVID, MD In 1 week. (hospital follow up)    Specialty:  Internal Medicine       The results of significant diagnostics from this hospitalization (including imaging, microbiology, ancillary and laboratory) are listed below for reference.    Significant Diagnostic Studies: Ct Angio Chest Pe W/cm &/or Wo Cm  08/10/2013   CLINICAL DATA:  Chest pain. Right-sided abdominal pain.  EXAM: CT ANGIOGRAPHY CHEST  CT ABDOMEN AND PELVIS WITH CONTRAST  TECHNIQUE: Multidetector CT imaging of the chest was performed using the standard protocol during bolus administration of intravenous contrast. Multiplanar CT image reconstructions including MIPs were obtained to evaluate the vascular anatomy. Multidetector CT imaging of the abdomen and pelvis was performed using the standard protocol during bolus administration of intravenous contrast.  CONTRAST:  OMNIPAQUE IOHEXOL 350 MG/ML SOLN  COMPARISON:  Abdominal CT 07/24/2008  FINDINGS: CTA CHEST FINDINGS  THORACIC INLET/BODY WALL:  Small or  resected right lobe thyroid gland.  No acute abnormality.  MEDIASTINUM:  Cardiomegaly, with notable enlargement of the right heart and borderline flattening of the interventricular septum. Extensive coronary artery atherosclerosis. No significant pericardial effusion. No acute vascular abnormality, including pulmonary embolism. Borderline enlarged lower paratracheal lymph nodes, likely incidental.  LUNG WINDOWS:  Opacity in the lower segment lingula has volume loss, compatible with atelectasis. No suspected consolidation or edema. Increased density of the lungs related to expiratory phase imaging. No effusion or pneumothorax. No calcified pleural plaques. The  OSSEOUS:  No acute fracture.  No suspicious lytic or blastic lesions.  CT ABDOMEN  and PELVIS FINDINGS  ABDOMEN/PELVIS:  Liver: No focal abnormality.  Biliary: Cholelithiasis. Gallbladder distention but no gallbladder inflammatory change. Calcification on the under surface of the gallbladder is likely peritoneal given the other peritoneal findings, and the lack of gallbladder wall thickening. Attention on followup.  Pancreas: Unremarkable.  Spleen: Irregular shape, likely from previous trauma or infarct.  Adrenals: Unremarkable.  Kidneys and ureters: Bilateral low dense renal lesions, the largest of enough to measure are water density, consistent with cysts. The largest on the right is 4.4 cm from the lower pole. The largest on the left is exophytic from the interpolar region, a 4.2 cm.  Bladder: Unremarkable.  Reproductive: Hysterectomy.  Bowel: Extensive colonic diverticulosis. No diverticulitis or colitis. No appendix seen.  Retroperitoneum: No mass or adenopathy.  Peritoneum: There is diffuse micro nodularity of the greater omentum with linear calcifications. The pericolic gutters likewise are thickened with plaque-like calcifications. There is mild thickening of the interloop peritoneal lining. No primary malignancy seen. No significant ascites.  Vascular:  No acute abnormality.  OSSEOUS: No acute abnormalities. No suspicious lytic or blastic lesions.  Review of the MIP images confirms the above findings.  IMPRESSION: CTA CHEST IMPRESSION  1. Negative for pulmonary embolism. 2. Lingular atelectasis. 3. No pleural plaques.  CT ABDOMEN and PELVIS IMPRESSION  1. Calcifying peritoneal disease which has slowly progressed since 2009. The morphology and slow progression favors an infectious or inflammatory process. Although less likely, an indolent neoplasm remains in the differential. If no diagnostic sampling pursued, followup CT imaging recommended in 3-6 months. 2. Cholelithiasis. 3. Colonic diverticulosis.   Electronically Signed   By: Tiburcio Pea M.D.   On: 08/10/2013 05:35   US Abdomen Complete  08/10/2013   CLINICAL DATA:  Abdominal pain.  EXAM: ULTRASOUND ABDOMEN COMPLETE  COMPARISON:  None.  FINDINGS: Gallbladder  Numerous shadowing gallstones. No sonographic Murphy sign or wall thickening.  Common bile duct  Diameter: 6 mm.  Liver  Diffuse increased echogenicity. No focal abnormality.  IVC  Not clearly seen.  Pancreas  Visualized portion unremarkable.  Spleen  Upper limits of normal size at 13 cm. No focal abnormality.  Right Kidney  Length: 13 cm. There are 2 simple appearing cystic lesions, 5 cm from the lower pole and 4.5 cm from the upper pole. No hydronephrosis.  Left Kidney  Length: 12 cm. Simple appearing cyst exophytic from the interpolar region, 4.5 cm maximal dimension. No hydronephrosis.  Abdominal aorta  No aneurysm visualized. Maximal diameter 2 cm.  IMPRESSION: 1. Cholelithiasis. No evidence of acute cholecystitis. 2. Hepatic steatosis. 3. Bilateral renal cysts.   Electronically Signed   By: Tiburcio Pea M.D.   On: 08/10/2013 03:08   Dg Abd Acute W/chest  08/10/2013   CLINICAL DATA:  Emesis. Pain.  EXAM: ACUTE ABDOMEN SERIES (ABDOMEN 2 VIEW & CHEST 1 VIEW)  COMPARISON:  01/11/2013 radiography of the chest  FINDINGS: Low volume lungs.  Interstitial opacity at the right base is likely atelectasis given it partly clears with right lateral decubitus positioning. Cardiomegaly without overt edema.  No evidence of dilated bowel. Underpenetration and motion decreases sensitivity for detecting other intra-abdominal pathology. Gallstones.  No acute osseous findings.  IMPRESSION: 1. No evidence of bowel obstruction. 2. Cholelithiasis. 3. Low lung volumes with basilar atelectasis.   Electronically Signed   By: Tiburcio Pea M.D.   On: 08/10/2013 01:11    Microbiology: Recent Results (from the past 240 hour(s))  MRSA PCR SCREENING     Status: None  Collection Time    08/10/13  9:56 AM      Result Value Range Status   MRSA by PCR NEGATIVE  NEGATIVE Final   Comment:            The GeneXpert MRSA Assay (FDA     approved for NASAL specimens     only), is one component of a     comprehensive MRSA colonization     surveillance program. It is not     intended to diagnose MRSA     infection nor to guide or     monitor treatment for     MRSA infections.     Labs: Basic Metabolic Panel:  Recent Labs Lab 08/10/13 0013 08/11/13 0511  NA 135 142  K 3.7 3.6  CL 95* 104  CO2 25 25  GLUCOSE 149* 94  BUN 17 14  CREATININE 1.00 1.03  CALCIUM 8.9 8.4   Liver Function Tests:  Recent Labs Lab 08/10/13 0013 08/11/13 0511  AST 22 16  ALT 11 9  ALKPHOS 74 61  BILITOT 0.4 0.3  PROT 7.2 6.2  ALBUMIN 3.6 3.1*    Recent Labs Lab 08/10/13 0013  LIPASE 19   No results found for this basename: AMMONIA,  in the last 168 hours CBC:  Recent Labs Lab 08/10/13 0013 08/11/13 0511  WBC 17.0* 7.5  NEUTROABS 16.0*  --   HGB 15.6* 12.1  HCT 44.6 37.1  MCV 89.7 91.8  PLT 209 211   Cardiac Enzymes:  Recent Labs Lab 08/10/13 0013 08/10/13 1205 08/10/13 1612 08/10/13 2242  TROPONINI <0.30 <0.30 <0.30 <0.30   BNP: BNP (last 3 results) No results found for this basename: PROBNP,  in the last 8760 hours CBG: No results  found for this basename: GLUCAP,  in the last 168 hours     Signed:  Marinda Elk  [facility] 08/11/2013, 9:24 AM

## 2013-08-16 LAB — CULTURE, BLOOD (ROUTINE X 2)
Culture: NO GROWTH
Culture: NO GROWTH

## 2013-08-30 ENCOUNTER — Ambulatory Visit (INDEPENDENT_AMBULATORY_CARE_PROVIDER_SITE_OTHER): Payer: Medicare Other | Admitting: Pharmacist

## 2013-08-30 DIAGNOSIS — Z79899 Other long term (current) drug therapy: Secondary | ICD-10-CM

## 2013-08-30 DIAGNOSIS — I4891 Unspecified atrial fibrillation: Secondary | ICD-10-CM

## 2013-08-30 LAB — BASIC METABOLIC PANEL
BUN: 17 mg/dL (ref 6–23)
CO2: 33 mEq/L — ABNORMAL HIGH (ref 19–32)
Calcium: 9.5 mg/dL (ref 8.4–10.5)
GFR: 51.88 mL/min — ABNORMAL LOW (ref >60.00)
Glucose, Bld: 89 mg/dL (ref 70–99)
Sodium: 139 mEq/L (ref 135–145)

## 2013-08-30 LAB — CBC
HCT: 43.1 % (ref 36.0–46.0)
MCHC: 33.5 g/dL (ref 30.0–36.0)
MCV: 89.3 fl (ref 78.0–100.0)
Platelets: 318 10*3/uL (ref 150.0–400.0)
RBC: 4.82 Mil/uL (ref 3.87–5.11)
WBC: 9.3 10*3/uL (ref 4.5–10.5)

## 2013-08-30 NOTE — Patient Instructions (Signed)
A full discussion of the nature of anticoagulants has been carried out.  A benefit/risk analysis has been presented to the patient, so that they understand the justification for choosing anticoagulation with Eliquis at this time.  The need for compliance is stressed.  Pt is aware to take the medication twice daily.  Side effects of potential bleeding are discussed, including unusual colored urine or stools, coughing up blood or coffee ground emesis, nose bleeds or serious fall or head trauma.  Discussed signs and symptoms of stroke. The patient should avoid any OTC items containing aspirin or ibuprofen.  Avoid alcohol consumption.   Call if any signs of abnormal bleeding.  Discussed financial obligations and resolved any difficulty in obtaining medication.  Next lab test test in 6 months by Dr. Oneta Rack.  Her prescription plan is now requiring her to use mail order ([facility]) so sent a 90 day supply with 0 refills to [facility].  They inform me the cost is manageable for them.  Dr. Oneta Rack will need to send in future refills as he will be managing her.

## 2013-08-30 NOTE — Progress Notes (Signed)
Pt was started on Eliquis on 07/19/13 for AFib..    Reviewed patients medication list.  Pt is not currently on any combined P-gp and strong CYP3A4 inhibitors/inducers (ketoconazole, traconazole, ritonavir, carbamazepine, phenytoin, rifampin, St. John's wort).  Reviewed labs.  SCr 1.0 on 08/10/13, Weight 236 lbs, CrCl- 71 ml/min.  Dose appropriate based on CrCl.   Hgb and HCT Within Normal Limits.  Patient would like to have follow up labs managed by his PCP, Dr. Oneta Rack.  I've instructed them that a CBC and BMET is recommended every 6 months while on Eliquis.  They are due to see Dr. Oneta Rack next week, and I gave them written instructions on a prescription to give to Dr. Oneta Rack regarding this.   A full discussion of the nature of anticoagulants has been carried out.  A benefit/risk analysis has been presented to the patient, so that they understand the justification for choosing anticoagulation with Eliquis at this time.  The need for compliance is stressed.  Pt is aware to take the medication twice daily.  Side effects of potential bleeding are discussed, including unusual colored urine or stools, coughing up blood or coffee ground emesis, nose bleeds or serious fall or head trauma.  Discussed signs and symptoms of stroke. The patient should avoid any OTC items containing aspirin or ibuprofen.  Avoid alcohol consumption.   Call if any signs of abnormal bleeding.  Discussed financial obligations and resolved any difficulty in obtaining medication.  Next lab test test is to be done in 6 months by Dr. Oneta Rack.  Her prescription plan is now requiring her to use mail order ([facility]) so sent a 90 day supply with 0 refills to [facility].  They inform me the cost is manageable for them.  Dr. Oneta Rack will need to send in future refills as he will be managing her.    Getting a CBC and BMET drawn today to make sure Scr and Hemoglobin still well controlled.  Will call patient with results today or tomorrow. Hemoglobin normal  at 14.4, and Scr stable at 1.1.  Patient notified.  Dr. Oneta Rack will monitor blood work if needed for Eliquis moving forward.

## 2013-08-31 ENCOUNTER — Telehealth: Payer: Self-pay

## 2013-08-31 NOTE — Telephone Encounter (Signed)
Message copied by Jarvis Newcomer on Fri Aug 31, 2013  9:35 AM ------      Message from: Verdis Prime      Created: Thu Aug 30, 2013  4:18 PM       Normal. ------

## 2013-08-31 NOTE — Telephone Encounter (Signed)
pt aware labs  Normal.pt verbalized understanding.

## 2013-09-01 ENCOUNTER — Encounter: Payer: Self-pay | Admitting: Internal Medicine

## 2013-09-01 DIAGNOSIS — I10 Essential (primary) hypertension: Secondary | ICD-10-CM | POA: Insufficient documentation

## 2013-09-01 DIAGNOSIS — E785 Hyperlipidemia, unspecified: Secondary | ICD-10-CM | POA: Insufficient documentation

## 2013-09-01 DIAGNOSIS — I609 Nontraumatic subarachnoid hemorrhage, unspecified: Secondary | ICD-10-CM | POA: Insufficient documentation

## 2013-09-01 DIAGNOSIS — R569 Unspecified convulsions: Secondary | ICD-10-CM | POA: Insufficient documentation

## 2013-09-01 DIAGNOSIS — G2581 Restless legs syndrome: Secondary | ICD-10-CM | POA: Insufficient documentation

## 2013-09-01 DIAGNOSIS — R7309 Other abnormal glucose: Secondary | ICD-10-CM | POA: Insufficient documentation

## 2013-09-04 ENCOUNTER — Ambulatory Visit: Payer: Medicare Other | Admitting: Internal Medicine

## 2013-09-04 ENCOUNTER — Ambulatory Visit: Payer: Self-pay | Admitting: Internal Medicine

## 2013-09-04 ENCOUNTER — Encounter: Payer: Self-pay | Admitting: Internal Medicine

## 2013-09-04 VITALS — BP 146/82 | HR 76 | Temp 97.2°F | Resp 18 | Ht 64.0 in | Wt 237.0 lb

## 2013-09-04 DIAGNOSIS — Z79899 Other long term (current) drug therapy: Secondary | ICD-10-CM

## 2013-09-04 DIAGNOSIS — G40309 Generalized idiopathic epilepsy and epileptic syndromes, not intractable, without status epilepticus: Secondary | ICD-10-CM

## 2013-09-04 DIAGNOSIS — G40909 Epilepsy, unspecified, not intractable, without status epilepticus: Secondary | ICD-10-CM

## 2013-09-04 DIAGNOSIS — E559 Vitamin D deficiency, unspecified: Secondary | ICD-10-CM

## 2013-09-04 DIAGNOSIS — E782 Mixed hyperlipidemia: Secondary | ICD-10-CM

## 2013-09-04 DIAGNOSIS — E785 Hyperlipidemia, unspecified: Secondary | ICD-10-CM

## 2013-09-04 DIAGNOSIS — R7309 Other abnormal glucose: Secondary | ICD-10-CM

## 2013-09-04 DIAGNOSIS — I10 Essential (primary) hypertension: Secondary | ICD-10-CM

## 2013-09-04 LAB — BASIC METABOLIC PANEL WITH GFR
CO2: 31 mEq/L (ref 19–32)
Calcium: 9.6 mg/dL (ref 8.4–10.5)
GFR, Est African American: 62 mL/min
Potassium: 3.8 mEq/L (ref 3.5–5.3)
Sodium: 142 mEq/L (ref 135–145)

## 2013-09-04 LAB — CBC WITH DIFFERENTIAL/PLATELET
Basophils Absolute: 0 10*3/uL (ref 0.0–0.1)
Eosinophils Absolute: 0.2 10*3/uL (ref 0.0–0.7)
Lymphocytes Relative: 29 % (ref 12–46)
Lymphs Abs: 2.3 10*3/uL (ref 0.7–4.0)
MCH: 30 pg (ref 26.0–34.0)
Neutro Abs: 5 10*3/uL (ref 1.7–7.7)
Neutrophils Relative %: 62 % (ref 43–77)
Platelets: 317 10*3/uL (ref 150–400)
RBC: 4.9 MIL/uL (ref 3.87–5.11)
RDW: 14.4 % (ref 11.5–15.5)
WBC: 8 10*3/uL (ref 4.0–10.5)

## 2013-09-04 LAB — LIPID PANEL
Cholesterol: 166 mg/dL (ref 0–200)
Total CHOL/HDL Ratio: 3.1 Ratio
Triglycerides: 156 mg/dL — ABNORMAL HIGH (ref <150)
VLDL: 31 mg/dL (ref 0–40)

## 2013-09-04 LAB — HEPATIC FUNCTION PANEL
AST: 17 U/L (ref 0–37)
Bilirubin, Direct: 0.1 mg/dL (ref 0.0–0.3)
Indirect Bilirubin: 0.3 mg/dL (ref 0.0–0.9)
Total Bilirubin: 0.4 mg/dL (ref 0.3–1.2)

## 2013-09-04 LAB — HEMOGLOBIN A1C: Mean Plasma Glucose: 126 mg/dL — ABNORMAL HIGH (ref <117)

## 2013-09-04 MED ORDER — VITAMIN D3 125 MCG (5000 UT) PO CAPS: 5000.0000 [IU] | ORAL_CAPSULE | ORAL | Status: AC

## 2013-09-04 MED ORDER — B COMPLEX PO TABS: 1.0000 | ORAL_TABLET | Freq: Every day | ORAL | Status: AC

## 2013-09-04 NOTE — Patient Instructions (Signed)
Continue diet and meds as discussed. Further disposition pending results of labs.       Calorie Counting Diet A calorie counting diet requires you to eat the number of calories that are right for you in a day. Calories are the measurement of how much energy you get from the food you eat. Eating the right amount of calories is important for staying at a healthy weight. If you eat too many calories, your body will store them as fat and you may gain weight. If you eat too few calories, you may lose weight. Counting the number of calories you eat during a day will help you know if you are eating the right amount. A Registered Dietitian can determine how many calories you need in a day. The amount of calories needed varies from person to person. If your goal is to lose weight, you will need to eat fewer calories. Losing weight can benefit you if you are overweight or have health problems such as heart disease, high blood pressure, or diabetes. If your goal is to gain weight, you will need to eat more calories. Gaining weight may be necessary if you have a certain health problem that causes your body to need more energy. TIPS Whether you are increasing or decreasing the number of calories you eat during a day, it may be hard to get used to changes in what you eat and drink. The following are tips to help you keep track of the number of calories you eat.  Measure foods at home with measuring cups. This helps you know the amount of food and number of calories you are eating.  Restaurants often serve food in amounts that are larger than 1 serving. While eating out, estimate how many servings of a food you are given. For example, a serving of cooked rice is  cup or about the size of half of a fist. Knowing serving sizes will help you be aware of how much food you are eating at restaurants.  Ask for smaller portion sizes or child-size portions at restaurants.  Plan to eat half of a meal at a  restaurant. Take the rest home or share the other half with a friend.  Read the Nutrition Facts panel on food labels for calorie content and serving size. You can find out how many servings are in a package, the size of a serving, and the number of calories each serving has.  For example, a package might contain 3 cookies. The Nutrition Facts panel on that package says that 1 serving is 1 cookie. Below that, it will say there are 3 servings in the container. The calories section of the Nutrition Facts label says there are 90 calories. This means there are 90 calories in 1 cookie (1 serving). If you eat 1 cookie you have eaten 90 calories. If you eat all 3 cookies, you have eaten 270 calories (3 servings x 90 calories = 270 calories). The list below tells you how big or small some common portion sizes are.  1 oz.........4 stacked dice.  3 oz........Mar[place] KitchenDeck of cards.  1 tsp.......[place] KitchenTip of little finger.  1 tbs......Marland KitchenMarland KitchenThumb.  2 tbs.......Marland KitchenGolf ball.   cup......Marland KitchenHalf of a fist.  1 cup.......Marland KitchenA fist. KEEP A FOOD LOG Write down every food item you eat, the amount you eat, and the number of calories in each food you eat during the day. At the end of the day, you can add up the total number of calories  you have eaten. It may help to keep a list like the one below. Find out the calorie information by reading the Nutrition Facts panel on food labels. Breakfast  Bran cereal (1 cup, 110 calories).  Fat-free milk ( cup, 45 calories). Snack  Apple (1 medium, 80 calories). Lunch  Spinach (1 cup, 20 calories).  Tomato ( medium, 20 calories).  Chicken breast strips (3 oz, 165 calories).  Shredded cheddar cheese ( cup, 110 calories).  Light [place] dressing (2 tbs, 60 calories).  Whole-wheat bread (1 slice, 80 calories).  Tub margarine (1 tsp, 35 calories).  Vegetable soup (1 cup, 160 calories). Dinner  Pork chop (3 oz, 190 calories).  Brown rice (1 cup, 215 calories).  Steamed  broccoli ( cup, 20 calories).  Strawberries (1  cup, 65 calories).  Whipped cream (1 tbs, 50 calories). Daily Calorie Total: 1425 Document Released: 10/11/2005 Document Revised: 01/03/2012 Document Reviewed: 04/07/2007 [facility] Patient Information 2014 [place], [place].   Atrial Fibrillation Atrial fibrillation is a type of irregular heart rhythm (arrhythmia). During atrial fibrillation, the upper chambers of the heart (atria) quiver continuously in a chaotic pattern. This causes an irregular and often rapid heart rate.  Atrial fibrillation is the result of the heart becoming overloaded with disorganized signals that tell it to beat. These signals are normally released one at a time by a part of the right atrium called the sinoatrial node. They then travel from the atria to the lower chambers of the heart (ventricles), causing the atria and ventricles to contract and pump blood as they pass. In atrial fibrillation, parts of the atria outside of the sinoatrial node also release these signals. This results in two problems. First, the atria receive so many signals that they do not have time to fully contract. Second, the ventricles, which can only receive one signal at a time, beat irregularly and out of rhythm with the atria.  There are three types of atrial fibrillation:   Paroxysmal Paroxysmal atrial fibrillation starts suddenly and stops on its own within a week.   Persistent Persistent atrial fibrillation lasts for more than a week. It may stop on its own or with treatment.   Permanent Permanent atrial fibrillation does not go away. Episodes of atrial fibrillation may lead to permanent atrial fibrillation.  Atrial fibrillation can prevent your heart from pumping blood normally. It increases your risk of stroke and can lead to heart failure.  CAUSES   Heart conditions, including a heart attack, heart failure, coronary artery disease, and heart valve conditions.   Inflammation of the  sac that surrounds the heart (pericarditis).   Blockage of an artery in the lungs (pulmonary embolism).   Pneumonia or other infections.   Chronic lung disease.   Thyroid problems, especially if the thyroid is overactive (hyperthyroidism).   Caffeine, excessive alcohol use, and use of some illegal drugs.   Use of some medications, including certain decongestants and diet pills.   Heart surgery.   Birth defects.  Sometimes, no cause can be found. When this happens, the atrial fibrillation is called lone atrial fibrillation. The risk of complications from atrial fibrillation increases if you have lone atrial fibrillation and you are age 44 years or older. RISK FACTORS  Heart failure.  Coronary artery disease  Diabetes mellitus.   High blood pressure (hypertension).   Obesity.   Other arrhythmias.   Increased age. SYMPTOMS   A feeling that your heart is beating rapidly or irregularly.   A feeling of discomfort or  pain in your chest.   Shortness of breath.   Sudden lightheadedness or weakness.   Getting tired easily when exercising.   Urinating more often than normal (mainly when atrial fibrillation first begins).  In paroxysmal atrial fibrillation, symptoms may start and suddenly stop. DIAGNOSIS  Your caregiver may be able to detect atrial fibrillation when taking your pulse. Usually, testing is needed to diagnosis atrial fibrillation. Tests may include:   Electrocardiography. During this test, the electrical impulses of your heart are recorded while you are lying down.   Echocardiography. During echocardiography, sound waves are used to evaluate how blood flows through your heart.   Stress test. There is more than one type of stress test. If a stress test is needed, ask your caregiver about which type is best for you.   Chest X-ray exam.   Blood tests.   Computed tomography (CT).  TREATMENT   Treating any underlying conditions. For  example, if you have an overactive thyroid, treating the condition may correct atrial fibrillation.   Medication. Medications may be given to control a rapid heart rate or to prevent blood clots, heart failure, or a stroke.   Procedure to correct the rhythm of the heart:  Electrical cardioversion. During electrical cardioversion, a controlled, low-energy shock is delivered to the heart through your skin. If you have chest pain, very low pressure blood pressure, or sudden heart failure, this procedure may need to be done as an emergency.  Catheter ablation. During this procedure, heart tissues that send the signals that cause atrial fibrillation are destroyed.  Maze or minimaze procedure. During this surgery, thin lines of heart tissue that carry the abnormal signals are destroyed. The maze procedure is an open-heart surgery. The minimaze procedure is a minimally invasive surgery. This means that small cuts are made to access the heart instead of a large opening.  Pulmonary venous isolation. During this surgery, tissue around the veins that carry blood from the lungs (pulmonary veins) is destroyed. This tissue is thought to carry the abnormal signals. HOME CARE INSTRUCTIONS   Take medications as directed by your caregiver.  Only take medications that your caregiver approves. Some medications can make atrial fibrillation worse or recur.  If blood thinners were prescribed by your caregiver, take them exactly as directed. Too much can cause bleeding. Too little and you will not have the needed protection against stroke and other problems.  Perform blood tests at home if directed by your caregiver.  Perform blood tests exactly as directed.   Quit smoking if you smoke.   Do not drink alcohol.   Do not drink caffeinated beverages such as coffee, soda, and some teas. You may drink decaffeinated coffee, soda, or tea.   Maintain a healthy weight. Do not use diet pills unless your  caregiver approves. They may make heart problems worse.   Follow diet instructions as directed by your caregiver.   Exercise regularly as directed by your caregiver.   Keep all follow-up appointments. PREVENTION  The following substances can cause atrial fibrillation to recur:   Caffeinated beverages.   Alcohol.   Certain medications, especially those used for breathing problems.   Certain herbs and herbal medications, such as those containing ephedra or ginseng.  Illegal drugs such as cocaine and amphetamines. Sometimes medications are given to prevent atrial fibrillation from recurring. Proper treatment of any underlying condition is also important in helping prevent recurrence.  SEEK MEDICAL CARE IF:  You notice a change in the rate, rhythm, or  strength of your heartbeat.   You suddenly begin urinating more frequently.   You tire more easily when exerting yourself or exercising.  SEEK IMMEDIATE MEDICAL CARE IF:   You develop chest pain, abdominal pain, sweating, or weakness.  You feel sick to your stomach (nauseous).  You develop shortness of breath.  You suddenly develop swollen feet and ankles.  You feel dizzy.  You face or limbs feel numb or weak.  There is a change in your vision or speech. MAKE SURE YOU:   Understand these instructions.  Will watch your condition.  Will get help right away if you are not doing well or get worse. Document Released: 10/11/2005 Document Revised: 02/05/2013 Document Reviewed: 11/21/2012 [facility] Patient Information 2014 [place], [place]. Hypertension As your heart beats, it forces blood through your arteries. This force is your blood pressure. If the pressure is too high, it is called hypertension (HTN) or high blood pressure. HTN is dangerous because you may have it and not know it. High blood pressure may mean that your heart has to work harder to pump blood. Your arteries may be narrow or stiff. The extra work puts  you at risk for heart disease, stroke, and other problems.  Blood pressure consists of two numbers, a higher number over a lower, 110/72, for example. It is stated as "110 over 72." The ideal is below 120 for the top number (systolic) and under 80 for the bottom (diastolic). Write down your blood pressure today. You should pay close attention to your blood pressure if you have certain conditions such as:  Heart failure.  Prior heart attack.  Diabetes  Chronic kidney disease.  Prior stroke.  Multiple risk factors for heart disease. To see if you have HTN, your blood pressure should be measured while you are seated with your arm held at the level of the heart. It should be measured at least twice. A one-time elevated blood pressure reading (especially in the Emergency Department) does not mean that you need treatment. There may be conditions in which the blood pressure is different between your right and left arms. It is important to see your caregiver soon for a recheck. Most people have essential hypertension which means that there is not a specific cause. This type of high blood pressure may be lowered by changing lifestyle factors such as:  Stress.  Smoking.  Lack of exercise.  Excessive weight.  Drug/tobacco/alcohol use.  Eating less salt. Most people do not have symptoms from high blood pressure until it has caused damage to the body. Effective treatment can often prevent, delay or reduce that damage. TREATMENT  When a cause has been identified, treatment for high blood pressure is directed at the cause. There are a large number of medications to treat HTN. These fall into several categories, and your caregiver will help you select the medicines that are best for you. Medications may have side effects. You should review side effects with your caregiver. If your blood pressure stays high after you have made lifestyle changes or started on medicines,   Your medication(s) may need  to be changed.  Other problems may need to be addressed.  Be certain you understand your prescriptions, and know how and when to take your medicine.  Be sure to follow up with your caregiver within the time frame advised (usually within two weeks) to have your blood pressure rechecked and to review your medications.  If you are taking more than one medicine to lower your blood  pressure, make sure you know how and at what times they should be taken. Taking two medicines at the same time can result in blood pressure that is too low. SEEK IMMEDIATE MEDICAL CARE IF:  You develop a severe headache, blurred or changing vision, or confusion.  You have unusual weakness or numbness, or a faint feeling.  You have severe chest or abdominal pain, vomiting, or breathing problems. MAKE SURE YOU:   Understand these instructions.  Will watch your condition.  Will get help right away if you are not doing well or get worse. Document Released: 10/11/2005 Document Revised: 01/03/2012 Document Reviewed: 05/31/2008 [facility] Patient Information 2014 [place], [place]. Cholesterol Cholesterol is a white, waxy, fat-like protein needed by your body in small amounts. The liver makes all the cholesterol you need. It is carried from the liver by the blood through the blood vessels. Deposits (plaque) may build up on blood vessel walls. This makes the arteries narrower and stiffer. Plaque increases the risk for heart attack and stroke. You cannot feel your cholesterol level even if it is very high. The only way to know is by a blood test to check your lipid (fats) levels. Once you know your cholesterol levels, you should keep a record of the test results. Work with your caregiver to to keep your levels in the desired range. WHAT THE RESULTS MEAN:  Total cholesterol is a rough measure of all the cholesterol in your blood.  LDL is the so-called bad cholesterol. This is the type that deposits cholesterol in the walls of  the arteries. You want this level to be low.  HDL is the good cholesterol because it cleans the arteries and carries the LDL away. You want this level to be high.  Triglycerides are fat that the body can either burn for energy or store. High levels are closely linked to heart disease. DESIRED LEVELS:  Total cholesterol below 200.  LDL below 100 for people at risk, below 70 for very high risk.  HDL above 50 is good, above 60 is best.  Triglycerides below 150. HOW TO LOWER YOUR CHOLESTEROL:  Diet.  Choose fish or white meat chicken and [place], roasted or baked. Limit fatty cuts of red meat, fried foods, and processed meats, such as sausage and lunch meat.  Eat lots of fresh fruits and vegetables. Choose whole grains, beans, pasta, potatoes and cereals.  Use only small amounts of olive, corn or canola oils. Avoid butter, mayonnaise, shortening or palm kernel oils. Avoid foods with trans-fats.  Use skim/nonfat milk and low-fat/nonfat yogurt and cheeses. Avoid whole milk, cream, ice cream, egg yolks and cheeses. Healthy desserts include angel food cake, ginger snaps, animal crackers, hard candy, popsicles, and low-fat/nonfat frozen yogurt. Avoid pastries, cakes, pies and cookies.  Exercise.  A regular program helps decrease LDL and raises HDL.  Helps with weight control.  Do things that increase your activity level like gardening, walking, or taking the stairs.  Medication.  May be prescribed by your caregiver to help lowering cholesterol and the risk for heart disease.  You may need medicine even if your levels are normal if you have several risk factors. HOME CARE INSTRUCTIONS   Follow your diet and exercise programs as suggested by your caregiver.  Take medications as directed.  Have blood work done when your caregiver feels it is necessary. MAKE SURE YOU:   Understand these instructions.  Will watch your condition.  Will get help right away if you are not doing well  or get worse. Document Released: 07/06/2001 Document Revised: 01/03/2012 Document Reviewed: 12/27/2007 [facility] Patient Information 2014 [place], [place].

## 2013-09-04 NOTE — Progress Notes (Signed)
Patient ID: [name], female   DOB: 1931-06-17, 79 y.o.   MRN: [id number]  HPI: Very nice 79 yo MWF presents for 3 month follow up with hypertension, hyperlipidemia, pre-diabetes and vitamin D deficiency.   BP has been controlled at home. Today's BP is 146/82 and at ranges between 110-130/60-70. Patient denies any cardiac type chest pain, palpitations, dyspnea/orthopnea/PND, dizziness, claudication, or dependent edema.Also patient has chronic A.Fib and in the last few months was changed by Dr Katrinka Blazing from warfarin to Eliquis. She denies any problems with bruising or bleeding.  Hyperlipidemia is controlled with diet & Crestor. Patient denies myalgias or other med SE's.   Also, patient has history of prediabetes/insulin resistance with last A1c of 6.0% and elevated insulin 38. Patient denies any visual blurring, diabetic polys or paresthesias. Patient is morbidly obese and refuses to comply with any dietary restrictions.  Further, Patient has history of vitamin D deficiency with pt supplementing same     Medication List              bisoprolol-hydrochlorothiazide 5-6.25 MG per tablet  Commonly known as:  ZIAC  Take 1 tablet by mouth every morning.     ELIQUIS 5 MG Tabs tablet  Generic drug:  apixaban  Take 5 mg by mouth 2 (two) times daily.     furosemide 40 MG tablet  Commonly known as:  LASIX  Take 40 mg by mouth every morning.     levETIRAcetam 500 MG tablet  Commonly known as:  KEPPRA  Take 500 mg by mouth 2 (two) times daily.     levothyroxine 88 MCG tablet  Commonly known as:  SYNTHROID, LEVOTHROID  Take 88 mcg by mouth every morning.     Magnesium 400 MG Caps  Take 800 mg by mouth 3 (three) times daily.     ondansetron 8 MG disintegrating tablet  Commonly known as:  ZOFRAN-ODT  Take 1 tablet (8 mg total) by mouth every 8 (eight) hours as needed for nausea.     OVER THE COUNTER MEDICATION  Take 1 tablet by mouth every morning. Slow release iron     pantoprazole 40 MG tablet  Commonly known as:  PROTONIX  Take 40 mg by mouth 2 (two) times daily before a meal.     polyethylene glycol packet  Commonly known as:  MIRALAX / GLYCOLAX  Take 17 g by mouth daily. Dissolve in water     potassium chloride SA 20 MEQ tablet  Commonly known as:  K-DUR,KLOR-CON  Take 20 mEq by mouth at bedtime.     rOPINIRole 2 MG tablet  Commonly known as:  REQUIP  Take 2 mg by mouth at bedtime.     rosuvastatin 20 MG tablet  Commonly known as:  CRESTOR  Take 20 mg by mouth every morning.         Allergies  Allergen Reactions  . Ace Inhibitors   . Atenolol   . Lyrica [Pregabalin] Other (See Comments)  . Trazodone And Nefazodone   . Adhesive [Tape] Rash     PMHx: Past Medical History  Diagnosis Date  . Stroke   . Thyroid disease   . GERD (gastroesophageal reflux disease)   . Arthritis     knees  . Anemia   . Cancer     throid ca  . CHF (congestive heart failure)   . Sleep apnea   . Shortness of breath   . Hypertension   . Hyperlipidemia   . Elevated hemoglobin A1c   .  SAH (subarachnoid hemorrhage)   . Vitamin D deficiency   . Hypothyroid   . RLS (restless legs syndrome)   . Seizures      FHx: Reviewed / unchanged  SHx: Reviewed / unchanged  Systems Review: Constitutional: Denies fever, chills, wt changes, headaches, insomnia, fatigue, night sweats, change in appetite. Eyes: Denies redness, blurred vision, diplopia, discharge, itchy, watery eyes.  ENT: Denies discharge, congestion, post nasal drip, epistaxis, sore throat, earache, hearing loss, dental pain, Tinnitus, Vertigo, Sinus pain, snoring.  CV: Denies chest pain, palpitations, irregular heartbeat, syncope, dyspnea, diaphoresis, orthopnea, PND, claudication, edema Respiratory: denies cough, dyspnea, DOE, pleurisy, hoarseness, laryngitis, wheezing.  Gastrointestinal: Denies dysphagia, odynophagia, heartburn, reflux, water brash, pain, cramps, nausea, vomiting, bloating,  diarrhea, constipation, hematemesis, melena, hematochezia, jaundice, hemorrhoids Genitourinary: Denies dysuria, frequency, urgency, nocturia, hesitancy, discharge, hematuria, flank pain Musculoskeletal: Denies arthralgias, myalgias, stiffness, Jt. Swelling, pain, limp, strain/sprain.  Skin: Denies pruritus, rash, hives, warts, acne, eczema, change in skin lesion(s) Neuro: No weakness, tremor, incoordination, spasms, paresthesia, pain Psychiatric: Denies confusion, memory loss, sensory loss Endo: Denies change in weight, skin, hair change, nocturia, diabetic polys, paresthesias, visual blurring, hyper/hypo-glycemic episodes.  Heme/Lymph: Excessive bleeding, bruising, enlarged lymph nodes  Filed Vitals:   09/04/13 1157  BP: 146/82  Pulse: 76  Temp: 97.2 F (36.2 C)  Resp: 18    Estimated body mass index is 40.66 kg/(m^2) as calculated from the following:   Height as of this encounter: 5\' 4"  (1.626 m).   Weight as of this encounter: 237 lb (107.502 kg).  On Exam: Appears well nourished - in no distress. Eyes: PERRLA, EOMs, conjunctiva no swelling or erythema. Sinuses: No frontal/maxillary tenderness ENT/Mouth: EAC's clear, TM's nl w/o erythema, bulging. Nares clear w/o erythema, swelling, exudates. Oropharynx clear. Dentition - unremarkable. No erythema. Tongue normal, non obstructing. Hearing intact.  Neck: Supple. Car 2+/2+. Thyroid nl. No LN, bruits or JVD. Chest: Respirations nl, clear  & equal BS w/o  rales, rhonchi, wheezing or stridor.  Cor: Heart sounds normal w/ sl irrregular rate and rhythm without sig. murmurs, gallops, clicks, or rubs. Peripheral pulses normal and equal  without edema.  Abdomen: Soft, flat & bowel sounds normal. Non-tender w/o guarding, rebound, hernias, masses, or organomegaly.  Lymphatics: Non tender without lymphadenopathy.  Musculoskeletal: Full ROM all peripheral extremities, joint stability, 5/5 strength, and normal gait.  Skin: Warm, dry without  exposed rashes, lesions, ecchymosis apparent.  Neuro: Cranial nerves intact, reflexes equal bilaterally. Sensory-motor testing grossly intact. Tendon reflexes grossly intact.  Pysch: Alert & oriented x 3. Insight and judgement nl & appropriate. No ideations.  Assessment and Plan:  1. Hypertension - Continue medication, monitor blood pressure at home. Continue diet/meds.  2. Hyperlipidemia - Continue diet/meds, exercise,& lifestyle modifications. Continue monitor cholesterol/LFT's.   3. Pre-diabetes/Insulin Resistance - Continue diet, exercise, lifestyle modifications. Monitor appropriate labs.   4. Vitamin D Deficiency - Continue supplementation.

## 2013-09-05 LAB — LEVETIRACETAM LEVEL: Keppra (Levetiracetam): 30.7 ug/mL — ABNORMAL HIGH (ref 5.0–30.0)

## 2013-09-05 LAB — VITAMIN D 25 HYDROXY (VIT D DEFICIENCY, FRACTURES): Vit D, 25-Hydroxy: 95 ng/mL — ABNORMAL HIGH (ref 30–89)

## 2013-09-26 ENCOUNTER — Other Ambulatory Visit: Payer: Self-pay | Admitting: Emergency Medicine

## 2013-09-26 MED ORDER — PANTOPRAZOLE SODIUM 40 MG PO TBEC: 40.0000 mg | DELAYED_RELEASE_TABLET | Freq: Two times a day (BID) | ORAL | Status: DC

## 2013-11-28 ENCOUNTER — Encounter: Payer: Self-pay | Admitting: Physician Assistant

## 2013-11-28 ENCOUNTER — Ambulatory Visit (INDEPENDENT_AMBULATORY_CARE_PROVIDER_SITE_OTHER): Payer: Medicare Other | Admitting: Physician Assistant

## 2013-11-28 VITALS — BP 128/64 | HR 88 | Temp 97.9°F | Resp 16 | Ht 64.0 in | Wt 239.0 lb

## 2013-11-28 DIAGNOSIS — J209 Acute bronchitis, unspecified: Secondary | ICD-10-CM

## 2013-11-28 DIAGNOSIS — Z79899 Other long term (current) drug therapy: Secondary | ICD-10-CM

## 2013-11-28 DIAGNOSIS — E785 Hyperlipidemia, unspecified: Secondary | ICD-10-CM

## 2013-11-28 DIAGNOSIS — E782 Mixed hyperlipidemia: Secondary | ICD-10-CM

## 2013-11-28 DIAGNOSIS — R7303 Prediabetes: Secondary | ICD-10-CM

## 2013-11-28 DIAGNOSIS — E559 Vitamin D deficiency, unspecified: Secondary | ICD-10-CM

## 2013-11-28 DIAGNOSIS — R7309 Other abnormal glucose: Secondary | ICD-10-CM

## 2013-11-28 DIAGNOSIS — E039 Hypothyroidism, unspecified: Secondary | ICD-10-CM

## 2013-11-28 DIAGNOSIS — I10 Essential (primary) hypertension: Secondary | ICD-10-CM

## 2013-11-28 LAB — BASIC METABOLIC PANEL WITH GFR
BUN: 17 mg/dL (ref 6–23)
CHLORIDE: 98 meq/L (ref 96–112)
CO2: 29 mEq/L (ref 19–32)
Calcium: 9.6 mg/dL (ref 8.4–10.5)
Creat: 1.11 mg/dL — ABNORMAL HIGH (ref 0.50–1.10)
GFR, Est African American: 54 mL/min — ABNORMAL LOW
GFR, Est Non African American: 47 mL/min — ABNORMAL LOW
GLUCOSE: 96 mg/dL (ref 70–99)
Potassium: 3.9 mEq/L (ref 3.5–5.3)
Sodium: 142 mEq/L (ref 135–145)

## 2013-11-28 LAB — CBC WITH DIFFERENTIAL/PLATELET
BASOS ABS: 0.1 10*3/uL (ref 0.0–0.1)
Basophils Relative: 1 % (ref 0–1)
EOS ABS: 0.2 10*3/uL (ref 0.0–0.7)
EOS PCT: 3 % (ref 0–5)
HCT: 42.9 % (ref 36.0–46.0)
Hemoglobin: 15 g/dL (ref 12.0–15.0)
LYMPHS ABS: 2.2 10*3/uL (ref 0.7–4.0)
Lymphocytes Relative: 27 % (ref 12–46)
MCH: 30.1 pg (ref 26.0–34.0)
MCHC: 35 g/dL (ref 30.0–36.0)
MCV: 86 fL (ref 78.0–100.0)
Monocytes Absolute: 0.8 10*3/uL (ref 0.1–1.0)
Monocytes Relative: 10 % (ref 3–12)
NEUTROS PCT: 59 % (ref 43–77)
Neutro Abs: 4.7 10*3/uL (ref 1.7–7.7)
PLATELETS: 269 10*3/uL (ref 150–400)
RBC: 4.99 MIL/uL (ref 3.87–5.11)
RDW: 14 % (ref 11.5–15.5)
WBC: 7.9 10*3/uL (ref 4.0–10.5)

## 2013-11-28 LAB — HEPATIC FUNCTION PANEL
ALK PHOS: 73 U/L (ref 39–117)
ALT: 13 U/L (ref 0–35)
AST: 21 U/L (ref 0–37)
Albumin: 4.5 g/dL (ref 3.5–5.2)
Bilirubin, Direct: 0.1 mg/dL (ref 0.0–0.3)
Indirect Bilirubin: 0.4 mg/dL (ref 0.2–1.2)
TOTAL PROTEIN: 7.4 g/dL (ref 6.0–8.3)
Total Bilirubin: 0.5 mg/dL (ref 0.2–1.2)

## 2013-11-28 LAB — LIPID PANEL
Cholesterol: 177 mg/dL (ref 0–200)
HDL: 58 mg/dL (ref >39)
LDL CALC: 89 mg/dL (ref 0–99)
Total CHOL/HDL Ratio: 3.1 Ratio
Triglycerides: 150 mg/dL — ABNORMAL HIGH (ref <150)
VLDL: 30 mg/dL (ref 0–40)

## 2013-11-28 LAB — HEMOGLOBIN A1C
Hgb A1c MFr Bld: 6.3 % — ABNORMAL HIGH (ref <5.7)
MEAN PLASMA GLUCOSE: 134 mg/dL — AB (ref <117)

## 2013-11-28 LAB — TSH: TSH: 3.302 u[IU]/mL (ref 0.350–4.500)

## 2013-11-28 LAB — MAGNESIUM: Magnesium: 1.7 mg/dL (ref 1.5–2.5)

## 2013-11-28 MED ORDER — UMECLIDINIUM-VILANTEROL 62.5-25 MCG/INH IN AEPB: INHALATION_SPRAY | RESPIRATORY_TRACT | Status: DC

## 2013-11-28 MED ORDER — ALBUTEROL SULFATE HFA 108 (90 BASE) MCG/ACT IN AERS: 2.0000 | INHALATION_SPRAY | Freq: Four times a day (QID) | RESPIRATORY_TRACT | Status: DC | PRN

## 2013-11-28 MED ORDER — AZITHROMYCIN 250 MG PO TABS: ORAL_TABLET | ORAL | Status: AC

## 2013-11-28 MED ORDER — PROMETHAZINE-CODEINE 6.25-10 MG/5ML PO SYRP: 5.0000 mL | ORAL_SOLUTION | Freq: Four times a day (QID) | ORAL | Status: DC | PRN

## 2013-11-28 MED ORDER — APIXABAN 5 MG PO TABS: 5.0000 mg | ORAL_TABLET | Freq: Two times a day (BID) | ORAL | Status: DC

## 2013-11-28 MED ORDER — APIXABAN 5 MG PO TABS
5.0000 mg | ORAL_TABLET | Freq: Two times a day (BID) | ORAL | Status: DC
Start: 2013-11-28 — End: 2013-11-28

## 2013-11-28 MED ORDER — PREDNISONE 20 MG PO TABS: ORAL_TABLET | ORAL | Status: DC

## 2013-11-28 NOTE — Progress Notes (Signed)
HPI 80 y.o. female  presents for 3 month follow up with hypertension, hyperlipidemia, prediabetes and vitamin D. Her blood pressure has been controlled at home, today their BP is BP: 128/64 mmHg She denies chest pain, shortness of breath, dizziness.  Her cholesterol is diet controlled. In addition they are on crestor and denies myalgias. Her cholesterol is controlled. The cholesterol last visit was:   Lab Results  Component Value Date   CHOL 166 09/04/2013   HDL 54 09/04/2013   LDLCALC 81 09/04/2013   TRIG 156* 09/04/2013   CHOLHDL 3.1 09/04/2013   She has been working on diet and exercise for prediabetes, and denies blurry vision and polydipsia. Last A1C in the office was:  Lab Results  Component Value Date   HGBA1C 6.0* 09/04/2013   Patient is on Vitamin D supplement.   She has had sinus symptoms for 1 week, cough with yellow production, wheezing, sinus congestion, some SOB with cough, has been on codeine cough syrup that helps some. No weight gain, PND, orthopnea.  Current Medications:  Current Outpatient Prescriptions on File Prior to Visit  Medication Sig Dispense Refill  . apixaban (ELIQUIS) 5 MG TABS tablet Take 5 mg by mouth 2 (two) times daily.      Marland Kitchen b complex vitamins tablet Take 1 tablet by mouth daily.      . bisoprolol-hydrochlorothiazide (ZIAC) 5-6.25 MG per tablet Take 1 tablet by mouth every morning.      . Cholecalciferol (VITAMIN D3) 5000 UNITS CAPS Take 5,000 Units by mouth every other day.      . furosemide (LASIX) 40 MG tablet Take 40 mg by mouth every morning.      . levETIRAcetam (KEPPRA) 500 MG tablet Take 500 mg by mouth 2 (two) times daily.      Marland Kitchen levothyroxine (SYNTHROID, LEVOTHROID) 88 MCG tablet Take 88 mcg by mouth every morning.      . Magnesium 400 MG CAPS Take 800 mg by mouth 3 (three) times daily.      . ondansetron (ZOFRAN-ODT) 8 MG disintegrating tablet Take 1 tablet (8 mg total) by mouth every 8 (eight) hours as needed for nausea.  20 tablet  0  .  OVER THE COUNTER MEDICATION Take 1 tablet by mouth every morning. Slow release iron      . pantoprazole (PROTONIX) 40 MG tablet Take 1 tablet (40 mg total) by mouth 2 (two) times daily before a meal.  90 tablet  2  . polyethylene glycol (MIRALAX / GLYCOLAX) packet Take 17 g by mouth daily. Dissolve in water      . potassium chloride SA (K-DUR,KLOR-CON) 20 MEQ tablet Take 20 mEq by mouth at bedtime.      Marland Kitchen rOPINIRole (REQUIP) 2 MG tablet Take 2 mg by mouth at bedtime.      . rosuvastatin (CRESTOR) 20 MG tablet Take 20 mg by mouth every morning.       No current facility-administered medications on file prior to visit.   Medical History:  Past Medical History  Diagnosis Date  . Stroke   . Thyroid disease   . GERD (gastroesophageal reflux disease)   . Arthritis     knees  . Anemia   . Cancer     throid ca  . CHF (congestive heart failure)   . Sleep apnea   . Shortness of breath   . Hypertension   . Hyperlipidemia   . Elevated hemoglobin A1c   . SAH (subarachnoid hemorrhage)   . Vitamin  D deficiency   . Hypothyroid   . RLS (restless legs syndrome)   . Seizures    Allergies:  Allergies  Allergen Reactions  . Ace Inhibitors   . Atenolol   . Lyrica [Pregabalin] Other (See Comments)  . Trazodone And Nefazodone   . Adhesive [Tape] Rash    ROS Constitutional: Denies fever, chills, headaches, insomnia, fatigue, night sweats Eyes: Denies redness, blurred vision, diplopia, discharge, itchy, watery eyes.  ENT: Denies congestion, post nasal drip, sore throat, earache, dental pain, Tinnitus, Vertigo, Sinus pain, snoring.  Cardio: Denies chest pain, palpitations, irregular heartbeat, dyspnea, diaphoresis, orthopnea, PND, claudication, edema Respiratory:  +cough, shortness of breath, wheezing.  Gastrointestinal: Denies dysphagia, heartburn, AB pain/ cramps, N/V, diarrhea, constipation, hematemesis, melena, hematochezia,  hemorrhoids Genitourinary: Denies dysuria, frequency, urgency,  nocturia, hesitancy, discharge, hematuria, flank pain Musculoskeletal: Denies myalgia, stiffness, pain, swelling and strain/sprain. Skin: Denies pruritis, rash, changing in skin lesion Neuro: Denies Weakness, tremor, incoordination, spasms, pain Psychiatric: Denies confusion, memory loss, sensory loss Endocrine: Denies change in weight, skin, hair change, nocturia Diabetic Polys, Denies visual blurring, hyper /hypo glycemic episodes, and paresthesia, Heme/Lymph: Denies Excessive bleeding, bruising, enlarged lymph nodes  Family history- Review and unchanged Social history- Review and unchanged Physical Exam: Filed Vitals:   11/28/13 1112  BP: 128/64  Pulse: 88  Temp: 97.9 F (36.6 C)  Resp: 16   Filed Weights   11/28/13 1112  Weight: 239 lb (108.41 kg)   General Appearance: Well nourished, in no apparent distress. Eyes: PERRLA, EOMs, conjunctiva no swelling or erythema Sinuses: No Frontal/maxillary tenderness ENT/Mouth: Ext aud canals clear, TMs without erythema, bulging. No erythema, swelling, or exudate on post pharynx.  Tonsils not swollen or erythematous. Hearing normal.  Neck: Supple, thyroid normal.  Respiratory: Respiratory effort normal,  with wheezing diffuse and decrease breath sounds without rales, rhonchi, or stridor.  Cardio: Irreg, irreg with no MRGs. Brisk peripheral pulses without edema.  Abdomen: Soft, + BS.  Non tender, no guarding, rebound, hernias, masses. Lymphatics: Non tender without lymphadenopathy.  Musculoskeletal: Full ROM, 5/5 strength, normal gait.  Skin: Warm, dry without rashes, lesions, ecchymosis.  Neuro: Cranial nerves intact. Normal muscle tone, no cerebellar symptoms. Sensation intact.  Psych: Awake and oriented X 3, normal affect, Insight and Judgment appropriate.   Assessment and Plan:  Hypertension: Continue medication, monitor blood pressure at home. Continue DASH diet. Cholesterol: Continue diet and exercise. Check cholesterol.   Pre-diabetes-Continue diet and exercise. Check A1C Vitamin D Def- check level and continue medications.  Acute bronchitis- zpak with refill, codeine cough syrup, albuterol  Continue diet and meds as discussed. Further disposition pending results of labs.  Vicie Mutters 11:33 AM

## 2013-11-28 NOTE — Patient Instructions (Signed)
Please take the prednisone to help decrease inflammation and therefore decrease symptoms. Take it it with food to avoid GI upset. It can cause increased energy but on the other hand it can make it hard to sleep at night so please take it in the morning.  It is not an antibiotic so you can stop it early if you are feeling better.  If you are diabetic it will increase your sugars.   The majority of colds are caused by viruses and do not require antibiotics. Please read the rest of this hand out to learn more about the common cold and what you can do to help yourself as well as help prevent the over use of antibiotics.   COMMON COLD SIGNS AND SYMPTOMS - The common cold usually causes nasal congestion, runny nose, and sneezing. A sore throat may be present on the first day but usually resolves quickly. If a cough occurs, it generally develops on about the fourth or fifth day of symptoms, typically when congestion and runny nose are resolving  COMMON COLD COMPLICATIONS - In most cases, colds do not cause serious illness or complications. Most colds last for three to seven days, although many people continue to have symptoms (coughing, sneezing, congestion) for up to two weeks.  One of the more common complications is sinusitis, which is usually caused by viruses and rarely (about 2 percent of the time) by bacteria. Having thick or yellow to green-colored nasal discharge does not mean that bacterial sinusitis has developed; discolored nasal discharge is a normal phase of the common cold.  Lower respiratory infections, such as pneumonia or bronchitis, may develop following a cold.  Infection of the middle ear, or otitis media, can accompany or follow a cold.  COMMON COLD TREATMENT - There is no specific treatment for the viruses that cause the common cold. Most treatments are aimed at relieving some of the symptoms of the cold, but do not shorten or cure the cold. Antibiotics are not useful for treating the  common cold; antibiotics are only used to treat illnesses caused by bacteria, not viruses. Unnecessary use of antibiotics for the treatment of the common cold can cause allergic reactions, diarrhea, or other gastrointestinal symptoms in some patients.  The symptoms of a cold will resolve over time, even without any treatment. People with underlying medical conditions and those who use other over-the-counter or prescription medications should speak with their healthcare provider or pharmacist to ensure that it is safe to use these treatments. The following are treatments that may reduce the symptoms caused by the common cold.  Nasal congestion - Decongestants are good for nasal congestion- if you feel very stuffy but no mucus is coming out, this is the medication that will help you the most.  Pseudoephedrine is a decongestant that can improve nasal congestion. Although a prescription is not required, drugstores in the [place] keep pseudoephedrine behind the counter, so it must be requested from a pharmacist. If you have a heart condition or high blood pressure please use Coricidin BPH instead.   Runny nose - Antihistamines such as diphenhydramine (Benadryl), certazine (Zyrtec) which are best taking at night because they can make you tired OR loratadine (Claritin),  fexafinadine (Allegra) help with a runny nose.   Nasal sprays such an oxymetazoline (Afrin and others) may also give temporary relief of nasal congestion. However, these sprays should never be used for more than two to three days; use for more than three days use can worsen congestion.    Nasocort is now over the counter and can help decrease a runny nose. Please stop the medication if you have blurry vision or nose bleeds.   Sore throat and headache - Sore throat and headache are best treated with a mild pain reliever such as acetaminophen (Tylenol) or a non-steroidal anti-inflammatory agent such as ibuprofen or naproxen (Motrin or Aleve).  These medications should be taken with food to prevent stomach problems. As well as gargling with warm water and salt.   Cough - Common cough medicine ingredients include guaifenesin and dextromethorphan; these are often combined with other medications in over-the-counter cold formulas. Often a cough is worse at night or first in the morning due to post nasal drip from you nose. You can try to sleep at an angle to decrease a cough.   Alternative treatments - Heated, humidified air can improve symptoms of nasal congestion and runny nose, and causes few to no side effects. A number of alternative products, including vitamin C, doubling up on your vitamin D and herbal products such as echinacea, may help. Certain products, such as nasal gels that contain zinc (eg, Zicam), have been associated with a permanent loss of smell.  Antibiotics - Antibiotics should not be used to treat an uncomplicated common cold. As noted above, colds are caused by viruses. Antibiotics treat bacterial, not viral infections. Some viruses that cause the common cold can also depress the immune system or cause swelling in the lining of the nose or airways; this can, in turn, lead to a bacterial infection. Often you need to give your body 7 days to fight off a common cold while treating the symptoms with the medications listed above. If after 7 days your symptoms are not improving, you are getting worse, you have shortness of breath, chest pain, a fever of over 103 you should seek medical help immediately.   PREVENTION IS THE BEST MEDICINE - Hand washing is an essential and highly effective way to prevent the spread of infection.  Alcohol-based hand rubs are a good alternative for disinfecting hands if a sink is not available.  Hands should be washed before preparing food and eating and after coughing, blowing the nose, or sneezing. While it is not always possible to limit contact with people who may be infected with a cold, touching  the eyes, nose, or mouth after direct contact should be avoided when possible. Sneezing/coughing into the sleeve of one's clothing (at the inner elbow) is another means of containing sprays of saliva and secretions and does not contaminate the hands.    Bad carbs also include fruit juice, alcohol, and sweet tea. These are empty calories that do not signal to your brain that you are full.   Please remember the good carbs are still carbs which convert into sugar. So please measure them out no more than 1/2-1 cup of rice, oatmeal, pasta, and beans.  Veggies are however free foods! Pile them on.   I like lean protein at every meal such as chicken, [place], pork chops, cottage cheese, etc. Just do not fry these meats and please center your meal around vegetable, the meats should be a side dish.   No all fruit is created equal. Please see the list below, the fruit at the bottom is higher in sugars than the fruit at the top

## 2013-11-29 ENCOUNTER — Other Ambulatory Visit: Payer: Self-pay

## 2013-11-29 LAB — VITAMIN D 25 HYDROXY (VIT D DEFICIENCY, FRACTURES): Vit D, 25-Hydroxy: 93 ng/mL — ABNORMAL HIGH (ref 30–89)

## 2013-11-29 LAB — INSULIN, FASTING: INSULIN FASTING, SERUM: 16 u[IU]/mL (ref 3–28)

## 2013-11-29 MED ORDER — UMECLIDINIUM-VILANTEROL 62.5-25 MCG/INH IN AEPB: INHALATION_SPRAY | RESPIRATORY_TRACT | Status: DC

## 2013-12-05 ENCOUNTER — Ambulatory Visit: Payer: Self-pay | Admitting: Emergency Medicine

## 2014-01-24 ENCOUNTER — Ambulatory Visit: Payer: Medicare Other | Admitting: Interventional Cardiology

## 2014-02-01 ENCOUNTER — Ambulatory Visit (INDEPENDENT_AMBULATORY_CARE_PROVIDER_SITE_OTHER): Payer: Medicare Other | Admitting: Interventional Cardiology

## 2014-02-01 ENCOUNTER — Encounter: Payer: Self-pay | Admitting: Interventional Cardiology

## 2014-02-01 VITALS — BP 112/80 | HR 63 | Ht 64.0 in | Wt 241.8 lb

## 2014-02-01 DIAGNOSIS — I4891 Unspecified atrial fibrillation: Secondary | ICD-10-CM

## 2014-02-01 DIAGNOSIS — I5032 Chronic diastolic (congestive) heart failure: Secondary | ICD-10-CM

## 2014-02-01 DIAGNOSIS — I10 Essential (primary) hypertension: Secondary | ICD-10-CM

## 2014-02-01 DIAGNOSIS — Z7901 Long term (current) use of anticoagulants: Secondary | ICD-10-CM | POA: Insufficient documentation

## 2014-02-01 DIAGNOSIS — I509 Heart failure, unspecified: Secondary | ICD-10-CM

## 2014-02-01 NOTE — Patient Instructions (Signed)
Your physician recommends that you continue on your current medications as directed. Please refer to the Current Medication list given to you today.  Your physician wants you to follow-up in: 1 year with Dr.Smith You will receive a reminder letter in the mail two months in advance. If you don't receive a letter, please call our office to schedule the follow-up appointment.  

## 2014-02-01 NOTE — Progress Notes (Signed)
Patient ID: [name], female   DOB: 1931/04/29, 80 y.o.   MRN: [id number]    [address], [address] [place], [place]  [address] Phone: [phone] Fax:  [phone]  Date:  02/01/2014   ID:  BASHA KRYGIER, DOB Oct 07, 1931, MRN [id number]  PCP:  Alesia Richards, MD   ASSESSMENT:  1. Chronic diastolic heart failure, stable 2. Chronic atrial fibrillation, with good rate control 3. Chronic anticoagulation therapy, no bleeding complications  PLAN:  1. No change in therapy 2. Clinical followup in one year   SUBJECTIVE: [name] is a 80 y.o. female who has had no recent cardiac issues. She did have an episode of dehydration requiring hospitalization in late 2014. There are no cardiac issues at that time. She denies orthopnea, PND, bleeding, and other complaints of circulatory origin. She does have occasional left arm twitching. This also occasionally happens in the right arm. She has a history of seizure disorder. She has taken her medication as prescribed.   Wt Readings from Last 3 Encounters:  02/01/14 241 lb 12.8 oz (109.68 kg)  11/28/13 239 lb (108.41 kg)  09/04/13 237 lb (107.502 kg)     Past Medical History  Diagnosis Date  . Stroke   . Thyroid disease   . GERD (gastroesophageal reflux disease)   . Arthritis     knees  . Anemia   . Cancer     throid ca  . CHF (congestive heart failure)   . Sleep apnea   . Shortness of breath   . Hypertension   . Hyperlipidemia   . Elevated hemoglobin A1c   . SAH (subarachnoid hemorrhage)   . Vitamin D deficiency   . Hypothyroid   . RLS (restless legs syndrome)   . Seizures     Current Outpatient Prescriptions  Medication Sig Dispense Refill  . apixaban (ELIQUIS) 5 MG TABS tablet Take 1 tablet (5 mg total) by mouth 2 (two) times daily.  180 tablet  4  . b complex vitamins tablet Take 1 tablet by mouth daily.      . bisoprolol-hydrochlorothiazide (ZIAC) 5-6.25 MG per tablet Take 1 tablet by mouth  every morning.      . Cholecalciferol (VITAMIN D3) 5000 UNITS CAPS Take 5,000 Units by mouth every other day.      . furosemide (LASIX) 40 MG tablet Take 40 mg by mouth every morning.      . levETIRAcetam (KEPPRA) 500 MG tablet Take 500 mg by mouth 2 (two) times daily.      Marland Kitchen levothyroxine (SYNTHROID, LEVOTHROID) 88 MCG tablet Take 88 mcg by mouth every morning.      . Magnesium 400 MG CAPS Take 800 mg by mouth 3 (three) times daily.      . ondansetron (ZOFRAN-ODT) 8 MG disintegrating tablet Take 1 tablet (8 mg total) by mouth every 8 (eight) hours as needed for nausea.  20 tablet  0  . OVER THE COUNTER MEDICATION Take 1 tablet by mouth every morning. Slow release iron      . pantoprazole (PROTONIX) 40 MG tablet Take 1 tablet (40 mg total) by mouth 2 (two) times daily before a meal.  90 tablet  2  . polyethylene glycol (MIRALAX / GLYCOLAX) packet Take 17 g by mouth daily. Dissolve in water      . potassium chloride SA (K-DUR,KLOR-CON) 20 MEQ tablet Take 20 mEq by mouth at bedtime.      Marland Kitchen rOPINIRole (REQUIP) 2 MG tablet  Take 2 mg by mouth at bedtime.      . rosuvastatin (CRESTOR) 20 MG tablet Take 20 mg by mouth every morning.       No current facility-administered medications for this visit.    Allergies:    Allergies  Allergen Reactions  . Ace Inhibitors   . Atenolol   . Lyrica [Pregabalin] Other (See Comments)  . Trazodone And Nefazodone   . Adhesive [Tape] Rash    Social History:  The patient  reports that she quit smoking about 19 years ago. Her smoking use included Cigarettes. She smoked 0.00 packs per day. She has never used smokeless tobacco. She reports that she does not drink alcohol or use illicit drugs.   ROS:  Please see the history of present illness.   Appetite is stable to   All other systems reviewed and negative.   OBJECTIVE: VS:  BP 112/80  Pulse 63  Ht 5\' 4"  (1.626 m)  Wt 241 lb 12.8 oz (109.68 kg)  BMI 41.48 kg/m2 Well nourished, well developed, in no acute  distress, elderly HEENT: normal Neck: JVD flat. Carotid bruit absent  Cardiac:  normal S1, S2; RRR; no murmur Lungs:  clear to auscultation bilaterally, no wheezing, rhonchi or rales Abd: soft, nontender, no hepatomegaly Ext: Edema absent. Pulses 2+ Skin: warm and dry Neuro:  CNs 2-12 intact, no focal abnormalities noted  EKG:  Not repeated       Signed, Illene Labrador III, MD 02/01/2014 11:00 AM

## 2014-02-28 ENCOUNTER — Encounter: Payer: Self-pay | Admitting: Internal Medicine

## 2014-02-28 ENCOUNTER — Ambulatory Visit (INDEPENDENT_AMBULATORY_CARE_PROVIDER_SITE_OTHER): Payer: Medicare Other | Admitting: Internal Medicine

## 2014-02-28 VITALS — BP 126/88 | HR 76 | Temp 97.9°F | Resp 18 | Ht 63.25 in | Wt 238.5 lb

## 2014-02-28 DIAGNOSIS — Z1331 Encounter for screening for depression: Secondary | ICD-10-CM

## 2014-02-28 DIAGNOSIS — Z0000 Encounter for general adult medical examination without abnormal findings: Secondary | ICD-10-CM

## 2014-02-28 DIAGNOSIS — Z789 Other specified health status: Secondary | ICD-10-CM

## 2014-02-28 DIAGNOSIS — Z79899 Other long term (current) drug therapy: Secondary | ICD-10-CM

## 2014-02-28 DIAGNOSIS — I10 Essential (primary) hypertension: Secondary | ICD-10-CM

## 2014-02-28 DIAGNOSIS — Z1212 Encounter for screening for malignant neoplasm of rectum: Secondary | ICD-10-CM

## 2014-02-28 DIAGNOSIS — R7303 Prediabetes: Secondary | ICD-10-CM

## 2014-02-28 DIAGNOSIS — E559 Vitamin D deficiency, unspecified: Secondary | ICD-10-CM | POA: Insufficient documentation

## 2014-02-28 DIAGNOSIS — E785 Hyperlipidemia, unspecified: Secondary | ICD-10-CM

## 2014-02-28 LAB — CBC WITH DIFFERENTIAL/PLATELET
BASOS ABS: 0.1 10*3/uL (ref 0.0–0.1)
BASOS PCT: 1 % (ref 0–1)
Eosinophils Absolute: 0.2 10*3/uL (ref 0.0–0.7)
Eosinophils Relative: 2 % (ref 0–5)
HCT: 42.1 % (ref 36.0–46.0)
Hemoglobin: 14.5 g/dL (ref 12.0–15.0)
Lymphocytes Relative: 29 % (ref 12–46)
Lymphs Abs: 2.3 10*3/uL (ref 0.7–4.0)
MCH: 30.1 pg (ref 26.0–34.0)
MCHC: 34.4 g/dL (ref 30.0–36.0)
MCV: 87.3 fL (ref 78.0–100.0)
Monocytes Absolute: 0.5 10*3/uL (ref 0.1–1.0)
Monocytes Relative: 6 % (ref 3–12)
NEUTROS PCT: 62 % (ref 43–77)
Neutro Abs: 4.9 10*3/uL (ref 1.7–7.7)
Platelets: 268 10*3/uL (ref 150–400)
RBC: 4.82 MIL/uL (ref 3.87–5.11)
RDW: 14.2 % (ref 11.5–15.5)
WBC: 7.9 10*3/uL (ref 4.0–10.5)

## 2014-02-28 NOTE — Progress Notes (Signed)
Patient ID: [name], female   DOB: 12/01/1930, 80 y.o.   MRN: [id number]   Annual Screening Comprehensive Examination  This very nice 80 y.o. MWF presents for complete physical.  Patient has been followed for HTN, Morbid Obesity,   Prediabetes, Hyperlipidemia, and Vitamin D Deficiency.    HTN predates since 80 when she presented with a SAH. 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 Patient is followed by Dr Daneen Schick.  Patient had been on chronic therapy with coumadin and recently was recently switched to Eliquis.  Patient's BP has been controlled at home. Today's BP: 126/88 mmHg. Patient denies any cardiac symptoms as chest pain, palpitations, shortness of breath, dizziness or ankle swelling.   Patient's hyperlipidemia is controlled with diet and medications.  Last cholesterol in Feb 2015 as at goal as below. Patient denies myalgias or other medication SE's. Lab Results  Component Value Date   CHOL 177 11/28/2013   HDL 58 11/28/2013   LDLCALC 89 11/28/2013   TRIG 150* 11/28/2013   CHOLHDL 3.1 11/28/2013    Patient has Morbid Obesity (BMI 42) and consequent prediabetes with A1c 6.5%  predating since 2010 and last A1c was 6.3% in Feb 2015. Patient denies reactive hypoglycemic symptoms, visual blurring, diabetic polys, or paresthesias.    Finally, patient has history of Vitamin D Deficiency of 12 in 2008 and last vitamin D was 93 in Feb 2015.   Medication Sig  . apixaban (ELIQUIS) 5 MG TABS tablet Take 1 tablet by mouth 2  times daily.  Marland Kitchen b complex vitamins tablet Take 1 tablet  daily.  . bisoprolol-hydrochlorothiazide (ZIAC) 5-6.25 MG  Take 1 tabletevery morning.  . Cholecalciferol (VITAMIN D3) 5000 UNITS CAPS Take 5,000 Units  every other day.  . furosemide (LASIX) 40 MG tablet Take 40 mg  every morning.  . levETIRAcetam (KEPPRA) 500 MG tablet Take 500 mg  2 (two) times daily.  Marland Kitchen levothyroxine  88 MCG tablet Take 88 mcg  every morning.  . Magnesium 400 MG  CAPS Take 800 mg  3 (three) times daily.  . ondansetron (ZOFRAN-ODT) 8 MG  Take 1 tablet (8 mg total)  every 8 (eight) hours as needed  . OVER THE COUNTER MEDICATION Take 1 tablet by mouth every morning. Slow release iron  . pantoprazole (PROTONIX) 40 MG tablet Take 1 tablet (40 mg total)  2 (two) times daily before a meal.  . potassium chloride SA (K-DUR,) 20 MEQ tablet Take 20 mEq  at bedtime.  Marland Kitchen rOPINIRole (REQUIP) 2 MG tablet Take 2 mg  at bedtime.  . rosuvastatin (CRESTOR) 20 MG tablet Take 20 mg  every morning.     Allergies  Allergen Reactions  . Ace Inhibitors   . Atenolol   . Lyrica [Pregabalin] Other (See Comments)  . Trazodone And Nefazodone   . Adhesive [Tape] Rash    Past Medical History  Diagnosis Date  . Stroke   . Thyroid disease   . GERD (gastroesophageal reflux disease)   . Arthritis     knees  . Anemia   . Cancer     throid ca  . CHF (congestive heart failure)   . Sleep apnea   . Shortness of breath   . Hypertension   . Hyperlipidemia   . Elevated hemoglobin A1c   . SAH (subarachnoid hemorrhage)   . Vitamin D deficiency   . Hypothyroid   . RLS (restless legs syndrome)   . Seizures  Past Surgical History  Procedure Laterality Date  . Abdominal hysterectomy    . Tonsillectomy    . Eye surgery    . Throidectomy    . Knee surgery    . Esophagogastroduodenoscopy  07/03/2012    Procedure: ESOPHAGOGASTRODUODENOSCOPY (EGD);  Surgeon: Arta Silence, MD;  Location: [facility];  Service: Endoscopy;  Laterality: N/A;  . Appendectomy  1960  . Thyroidectomy N/A 1976    Subtotal     Family History  Problem Relation Age of Onset  . Heart attack Mother   . Diabetes Mother   . Heart disease Mother   . Heart attack Father   . Hypertension Father   . Heart disease Father   . Heart disease Sister   . CVA Sister   . Heart disease Brother   . Heart disease Brother   . Cancer Brother     colon  . Hyperlipidemia Brother   . Heart disease Sister      History  Substance Use Topics  . Smoking status: Former Smoker    Types: Cigarettes    Quit date: 10/25/1994  . Smokeless tobacco: Never Used  . Alcohol Use: No    ROS Constitutional: Denies fever, chills, weight loss/gain, headaches, insomnia, fatigue, night sweats, and change in appetite. Eyes: Denies redness, blurred vision, diplopia, discharge, itchy, watery eyes.  ENT: Denies discharge, congestion, post nasal drip, epistaxis, sore throat, earache, hearing loss, dental pain, Tinnitus, Vertigo, Sinus pain, snoring.  Cardio: Denies chest pain, palpitations, irregular heartbeat, syncope, dyspnea, diaphoresis, orthopnea, PND, claudication, edema Respiratory: denies cough, dyspnea, DOE, pleurisy, hoarseness, laryngitis, wheezing.  Gastrointestinal: Denies dysphagia, heartburn, reflux, water brash, pain, cramps, nausea, vomiting, bloating, diarrhea, constipation, hematemesis, melena, hematochezia, jaundice, hemorrhoids Genitourinary: Denies dysuria, frequency, urgency, nocturia, hesitancy, discharge, hematuria, flank pain Breast:Breast lumps, nipple discharge, bleeding.  Musculoskeletal: Denies arthralgia, myalgia, stiffness, Jt. Swelling, pain, limp, and strain/sprain. Skin: Denies puritis, rash, hives, warts, acne, eczema, changing in skin lesion Neuro: No weakness, tremor, incoordination, spasms, paresthesia, pain Psychiatric: Denies confusion, memory loss, sensory loss Endocrine: Denies change in weight, skin, hair change, nocturia, and paresthesia, diabetic polys, visual blurring, hyper / hypo glycemic episodes.  Heme/Lymph: No excessive bleeding, bruising, enlarged lymph nodes.   Physical Exam  BP 126/88  Pulse 76  Temp  97.9 F   Resp 18  Ht 5' 3.25"   Wt 238 lb 8 oz   BMI 41.89 kg/m2  General Appearance: Well nourished, in no apparent distress. Eyes: PERRLA, EOMs, conjunctiva no swelling or erythema, normal fundi and vessels. Sinuses: No frontal/maxillary  tenderness ENT/Mouth: EACs patent / TMs  nl. Nares clear without erythema, swelling, mucoid exudates. Oral hygiene is good. No erythema, swelling, or exudate. Tongue normal, non-obstructing. Tonsils not swollen or erythematous. Hearing normal.  Neck: Supple, thyroid normal. No bruits, nodes or JVD. Respiratory: Respiratory effort normal.  BS equal and clear bilateral without rales, rhonci, wheezing or stridor. Cardio: Heart sounds are normal with regular rate and rhythm and no murmurs, rubs or gallops. Peripheral pulses are normal and equal bilaterally without edema. No aortic or femoral bruits. Chest: symmetric with normal excursions and percussion. Breasts: Symmetric, without lumps, nipple discharge, retractions, or fibrocystic changes.  Abdomen: Flat, soft, with bowl sounds. Nontender, no guarding, rebound, hernias, masses, or organomegaly.  Lymphatics: Non tender without lymphadenopathy.  Genitourinary:  Musculoskeletal: Full ROM all peripheral extremities, joint stability, 5/5 strength, and normal gait. Skin: Warm and dry without rashes, lesions, cyanosis, clubbing or  ecchymosis.  Neuro: Cranial nerves intact,  reflexes equal bilaterally. Normal muscle tone, no cerebellar symptoms. Sensation intact.  Pysch: Awake and oriented X 3, normal affect, Insight and Judgment appropriate.   Assessment and Plan  1. Annual Screening Examination 2. Hypertension  3. Hyperlipidemia 4. Pre Diabetes 5. Vitamin D Deficiency 6. Ch Afibrillation   Continue prudent diet as discussed, weight control, BP monitoring, regular exercise, and medications. Discussed med's effects and SE's. Screening labs and tests as requested with regular follow-up as recommended.

## 2014-02-28 NOTE — Patient Instructions (Signed)

## 2014-03-01 LAB — HEPATIC FUNCTION PANEL
ALT: 13 U/L (ref 0–35)
AST: 20 U/L (ref 0–37)
Albumin: 4.2 g/dL (ref 3.5–5.2)
Alkaline Phosphatase: 64 U/L (ref 39–117)
BILIRUBIN DIRECT: 0.1 mg/dL (ref 0.0–0.3)
BILIRUBIN INDIRECT: 0.3 mg/dL (ref 0.2–1.2)
TOTAL PROTEIN: 6.6 g/dL (ref 6.0–8.3)
Total Bilirubin: 0.4 mg/dL (ref 0.2–1.2)

## 2014-03-01 LAB — MICROALBUMIN / CREATININE URINE RATIO
Creatinine, Urine: 148.6 mg/dL
MICROALB UR: 13.16 mg/dL — AB (ref 0.00–1.89)
Microalb Creat Ratio: 88.6 mg/g — ABNORMAL HIGH (ref 0.0–30.0)

## 2014-03-01 LAB — LIPID PANEL
CHOLESTEROL: 174 mg/dL (ref 0–200)
HDL: 54 mg/dL (ref >39)
LDL Cholesterol: 90 mg/dL (ref 0–99)
TRIGLYCERIDES: 149 mg/dL (ref <150)
Total CHOL/HDL Ratio: 3.2 Ratio
VLDL: 30 mg/dL (ref 0–40)

## 2014-03-01 LAB — BASIC METABOLIC PANEL WITH GFR
BUN: 15 mg/dL (ref 6–23)
CO2: 30 mEq/L (ref 19–32)
Calcium: 9.4 mg/dL (ref 8.4–10.5)
Chloride: 97 mEq/L (ref 96–112)
Creat: 1 mg/dL (ref 0.50–1.10)
GFR, EST NON AFRICAN AMERICAN: 53 mL/min — AB
GFR, Est African American: 61 mL/min
Glucose, Bld: 94 mg/dL (ref 70–99)
Potassium: 3.7 mEq/L (ref 3.5–5.3)
SODIUM: 141 meq/L (ref 135–145)

## 2014-03-01 LAB — HEMOGLOBIN A1C
Hgb A1c MFr Bld: 6.1 % — ABNORMAL HIGH (ref <5.7)
Mean Plasma Glucose: 128 mg/dL — ABNORMAL HIGH (ref <117)

## 2014-03-01 LAB — URINALYSIS, MICROSCOPIC ONLY: Crystals: NONE SEEN

## 2014-03-01 LAB — INSULIN, FASTING: Insulin fasting, serum: 21 u[IU]/mL (ref 3–28)

## 2014-03-01 LAB — VITAMIN D 25 HYDROXY (VIT D DEFICIENCY, FRACTURES): Vit D, 25-Hydroxy: 94 ng/mL — ABNORMAL HIGH (ref 30–89)

## 2014-03-01 LAB — TSH: TSH: 2.977 u[IU]/mL (ref 0.350–4.500)

## 2014-03-01 LAB — MAGNESIUM: MAGNESIUM: 2 mg/dL (ref 1.5–2.5)

## 2014-03-02 LAB — LEVETIRACETAM LEVEL: Keppra (Levetiracetam): 14.5 ug/mL

## 2014-03-03 ENCOUNTER — Other Ambulatory Visit: Payer: Self-pay | Admitting: Internal Medicine

## 2014-03-03 MED ORDER — METFORMIN HCL ER 500 MG PO TB24: 500.0000 mg | ORAL_TABLET | Freq: Every day | ORAL | Status: DC

## 2014-03-03 MED ORDER — METFORMIN HCL ER 500 MG PO TB24: ORAL_TABLET | ORAL | Status: DC

## 2014-03-05 ENCOUNTER — Ambulatory Visit (INDEPENDENT_AMBULATORY_CARE_PROVIDER_SITE_OTHER): Payer: Medicare Other | Admitting: *Deleted

## 2014-03-05 DIAGNOSIS — N3000 Acute cystitis without hematuria: Secondary | ICD-10-CM

## 2014-03-05 NOTE — Progress Notes (Signed)
Patient ID: [name], female   DOB: 1931-06-29, 80 y.o.   MRN: [id number] Patient was seen 02/28/14 and urine looks suspicious for possible infection per Dr. Melford Aase.  Unable to add urine culture onto labs since it was over the 24 hour period per [place] labs.  Patient here today to provide urine specimen to check UC for possible infection.  Will wait on culture for further treatment if needed, since patient denies any current UTI symptoms.

## 2014-03-06 ENCOUNTER — Other Ambulatory Visit: Payer: Self-pay | Admitting: Internal Medicine

## 2014-03-06 DIAGNOSIS — E119 Type 2 diabetes mellitus without complications: Secondary | ICD-10-CM

## 2014-03-06 LAB — URINE CULTURE
COLONY COUNT: NO GROWTH
Organism ID, Bacteria: NO GROWTH

## 2014-03-06 MED ORDER — METFORMIN HCL ER 500 MG PO TB24: ORAL_TABLET | ORAL | Status: DC

## 2014-03-14 ENCOUNTER — Ambulatory Visit (INDEPENDENT_AMBULATORY_CARE_PROVIDER_SITE_OTHER): Payer: Medicare Other | Admitting: Physician Assistant

## 2014-03-14 ENCOUNTER — Encounter: Payer: Self-pay | Admitting: Physician Assistant

## 2014-03-14 VITALS — BP 146/80 | HR 76 | Temp 97.4°F | Resp 16 | Wt 237.0 lb

## 2014-03-14 DIAGNOSIS — R42 Dizziness and giddiness: Secondary | ICD-10-CM

## 2014-03-14 LAB — CBC WITH DIFFERENTIAL/PLATELET
BASOS ABS: 0 10*3/uL (ref 0.0–0.1)
BASOS PCT: 0 % (ref 0–1)
EOS ABS: 0.1 10*3/uL (ref 0.0–0.7)
Eosinophils Relative: 1 % (ref 0–5)
HCT: 43.3 % (ref 36.0–46.0)
Hemoglobin: 14.7 g/dL (ref 12.0–15.0)
Lymphocytes Relative: 29 % (ref 12–46)
Lymphs Abs: 2.4 10*3/uL (ref 0.7–4.0)
MCH: 29.9 pg (ref 26.0–34.0)
MCHC: 33.9 g/dL (ref 30.0–36.0)
MCV: 88.2 fL (ref 78.0–100.0)
Monocytes Absolute: 0.5 10*3/uL (ref 0.1–1.0)
Monocytes Relative: 6 % (ref 3–12)
NEUTROS PCT: 64 % (ref 43–77)
Neutro Abs: 5.3 10*3/uL (ref 1.7–7.7)
Platelets: 273 10*3/uL (ref 150–400)
RBC: 4.91 MIL/uL (ref 3.87–5.11)
RDW: 14 % (ref 11.5–15.5)
WBC: 8.3 10*3/uL (ref 4.0–10.5)

## 2014-03-14 LAB — HEPATIC FUNCTION PANEL
ALBUMIN: 4.1 g/dL (ref 3.5–5.2)
ALT: 12 U/L (ref 0–35)
AST: 18 U/L (ref 0–37)
Alkaline Phosphatase: 68 U/L (ref 39–117)
BILIRUBIN TOTAL: 0.6 mg/dL (ref 0.2–1.2)
Bilirubin, Direct: 0.1 mg/dL (ref 0.0–0.3)
Indirect Bilirubin: 0.5 mg/dL (ref 0.2–1.2)
TOTAL PROTEIN: 6.9 g/dL (ref 6.0–8.3)

## 2014-03-14 LAB — BASIC METABOLIC PANEL WITH GFR
BUN: 19 mg/dL (ref 6–23)
CALCIUM: 9.9 mg/dL (ref 8.4–10.5)
CO2: 32 mEq/L (ref 19–32)
CREATININE: 1.13 mg/dL — AB (ref 0.50–1.10)
Chloride: 96 mEq/L (ref 96–112)
GFR, EST AFRICAN AMERICAN: 53 mL/min — AB
GFR, Est Non African American: 46 mL/min — ABNORMAL LOW
Glucose, Bld: 92 mg/dL (ref 70–99)
Potassium: 3.9 mEq/L (ref 3.5–5.3)
SODIUM: 140 meq/L (ref 135–145)

## 2014-03-14 NOTE — Patient Instructions (Signed)
Vertigo Vertigo means you feel like you or your surroundings are moving when they are not. Vertigo can be dangerous if it occurs when you are at work, driving, or performing difficult activities.  CAUSES  Vertigo occurs when there is a conflict of signals sent to your brain from the visual and sensory systems in your body. There are many different causes of vertigo, including:  Infections, especially in the inner ear.  A bad reaction to a drug or misuse of alcohol and medicines.  Withdrawal from drugs or alcohol.  Rapidly changing positions, such as lying down or rolling over in bed.  A migraine headache.  Decreased blood flow to the brain.  Increased pressure in the brain from a head injury, infection, tumor, or bleeding. SYMPTOMS  You may feel as though the world is spinning around or you are falling to the ground. Because your balance is upset, vertigo can cause nausea and vomiting. You may have involuntary eye movements (nystagmus). DIAGNOSIS  Vertigo is usually diagnosed by physical exam. If the cause of your vertigo is unknown, your caregiver may perform imaging tests, such as an MRI scan (magnetic resonance imaging). TREATMENT  Most cases of vertigo resolve on their own, without treatment. Depending on the cause, your caregiver may prescribe certain medicines. If your vertigo is related to body position issues, your caregiver may recommend movements or procedures to correct the problem. In rare cases, if your vertigo is caused by certain inner ear problems, you may need surgery. HOME CARE INSTRUCTIONS   Follow your caregiver's instructions.  Avoid driving.  Avoid operating heavy machinery.  Avoid performing any tasks that would be dangerous to you or others during a vertigo episode.  Tell your caregiver if you notice that certain medicines seem to be causing your vertigo. Some of the medicines used to treat vertigo episodes can actually make them worse in some people. SEEK  IMMEDIATE MEDICAL CARE IF:   Your medicines do not relieve your vertigo or are making it worse.  You develop problems with talking, walking, weakness, or using your arms, hands, or legs.  You develop severe headaches.  Your nausea or vomiting continues or gets worse.  You develop visual changes.  A family member notices behavioral changes.  Your condition gets worse. MAKE SURE YOU:  Understand these instructions.  Will watch your condition.  Will get help right away if you are not doing well or get worse. Document Released: 07/21/2005 Document Revised: 01/03/2012 Document Reviewed: 04/29/2011 [employer] Patient Information 2014 [employer].  

## 2014-03-14 NOTE — Progress Notes (Signed)
   Subjective:    Patient ID: [name], female    DOB: 10-24-1931, 80 y.o.   MRN: [id number]  HPI 80 y.o. female with history of CVA, afib, presents with lightheadedness, feels heart beat in head since she states she started the metformin. Sugar has been 98, 106 and her husband has a 4 month average of 102. Her BP at home has been running 123-130's. Denies nausea, CP, SOB, changes in vision. Diarrhea, bleeding. Her Keppra level was just recently increased to 3 pills a day 2 weeks ago.    Wt Readings from Last 3 Encounters:  03/14/14 237 lb (107.502 kg)  02/28/14 238 lb 8 oz (108.183 kg)  02/01/14 241 lb 12.8 oz (109.68 kg)   Review of Systems  Constitutional: Negative.   HENT: Negative.   Respiratory: Negative.   Cardiovascular: Negative.   Gastrointestinal: Negative.   Genitourinary: Negative.   Neurological: Positive for dizziness and light-headedness. Negative for tremors, seizures, syncope, facial asymmetry, speech difficulty, weakness, numbness and headaches.  Hematological: Negative.        Objective:   Physical Exam  Constitutional: She is oriented to person, place, and time. She appears well-developed and well-nourished. No distress.  obese  HENT:  Head: Normocephalic and atraumatic.  Right Ear: External ear normal.  Left Ear: External ear normal.  Mouth/Throat: Oropharynx is clear and moist.  Eyes: Conjunctivae and EOM are normal. Pupils are equal, round, and reactive to light.  Neck: Normal range of motion. Neck supple.  Cardiovascular: Normal rate and normal pulses.  An irregularly irregular rhythm present.  Pulmonary/Chest: Effort normal and breath sounds normal. She has no wheezes.  Abdominal: Soft. Bowel sounds are normal. There is no tenderness.  obese  Musculoskeletal: Normal range of motion.  Slow, careful gait  Lymphadenopathy:    She has no cervical adenopathy.  Neurological: She is alert and oriented to person, place, and time. No cranial nerve  deficit.  Skin: Skin is warm and dry.  Psychiatric: She has a normal mood and affect. Her behavior is normal. Thought content normal.       Assessment & Plan:  Dizziness-  Recent increase in Keppra level- check BMP, LFTs, CBC, and Keppra level Stop metformin, recent normal UA and no symptoms Increase fluids, monitor BP at home If any worsening symptoms go to ER.

## 2014-03-16 LAB — LEVETIRACETAM LEVEL: KEPPRA (LEVETIRACETAM): 33.9 ug/mL

## 2014-04-09 ENCOUNTER — Encounter: Payer: Self-pay | Admitting: Internal Medicine

## 2014-04-09 ENCOUNTER — Other Ambulatory Visit: Payer: Self-pay | Admitting: *Deleted

## 2014-04-09 ENCOUNTER — Ambulatory Visit (INDEPENDENT_AMBULATORY_CARE_PROVIDER_SITE_OTHER): Payer: Medicare Other | Admitting: Physician Assistant

## 2014-04-09 VITALS — BP 128/76 | HR 76 | Temp 97.4°F | Resp 16 | Ht 64.0 in | Wt 239.6 lb

## 2014-04-09 DIAGNOSIS — R42 Dizziness and giddiness: Secondary | ICD-10-CM

## 2014-04-09 DIAGNOSIS — E860 Dehydration: Secondary | ICD-10-CM

## 2014-04-09 LAB — BASIC METABOLIC PANEL WITH GFR
BUN: 17 mg/dL (ref 6–23)
CO2: 33 mEq/L — ABNORMAL HIGH (ref 19–32)
Calcium: 9.4 mg/dL (ref 8.4–10.5)
Chloride: 96 mEq/L (ref 96–112)
Creat: 1.05 mg/dL (ref 0.50–1.10)
GFR, EST AFRICAN AMERICAN: 58 mL/min — AB
GFR, EST NON AFRICAN AMERICAN: 50 mL/min — AB
Glucose, Bld: 100 mg/dL — ABNORMAL HIGH (ref 70–99)
Potassium: 3.5 mEq/L (ref 3.5–5.3)
SODIUM: 140 meq/L (ref 135–145)

## 2014-04-09 MED ORDER — LEVOTHYROXINE SODIUM 88 MCG PO TABS: 88.0000 ug | ORAL_TABLET | Freq: Every morning | ORAL | Status: DC

## 2014-04-09 MED ORDER — ROSUVASTATIN CALCIUM 40 MG PO TABS: 40.0000 mg | ORAL_TABLET | Freq: Every day | ORAL | Status: DC

## 2014-04-09 MED ORDER — BISOPROLOL-HYDROCHLOROTHIAZIDE 5-6.25 MG PO TABS: 1.0000 | ORAL_TABLET | Freq: Every morning | ORAL | Status: DC

## 2014-04-09 MED ORDER — ROPINIROLE HCL 2 MG PO TABS: 2.0000 mg | ORAL_TABLET | Freq: Every day | ORAL | Status: DC

## 2014-04-09 MED ORDER — LEVETIRACETAM 500 MG PO TABS: 500.0000 mg | ORAL_TABLET | Freq: Three times a day (TID) | ORAL | Status: DC

## 2014-04-09 NOTE — Progress Notes (Signed)
HPI 80 y.o.female with history of CVA, thyroid CA, CHF, HTN, DM, hypothyroid presents for 1 month follow up. She presented 1 month ago with dizziness and headache after starting metformin. Metformin was discontinued and her Keppra level was found to be normal and she was dehydrated. She presents today and states that her dizziness has revolved, she is feeling better, her BP at home has been 120/60's.    Past Medical History  Diagnosis Date  . Stroke   . Thyroid disease   . GERD (gastroesophageal reflux disease)   . Arthritis     knees  . Anemia   . Cancer     throid ca  . CHF (congestive heart failure)   . Sleep apnea   . Shortness of breath   . Hypertension   . Hyperlipidemia   . Elevated hemoglobin A1c   . SAH (subarachnoid hemorrhage)   . Vitamin D deficiency   . Hypothyroid   . RLS (restless legs syndrome)   . Seizures      Allergies  Allergen Reactions  . Ace Inhibitors   . Atenolol   . Lyrica [Pregabalin] Other (See Comments)  . Trazodone And Nefazodone   . Adhesive [Tape] Rash      Current Outpatient Prescriptions on File Prior to Visit  Medication Sig Dispense Refill  . apixaban (ELIQUIS) 5 MG TABS tablet Take 1 tablet (5 mg total) by mouth 2 (two) times daily.  180 tablet  4  . b complex vitamins tablet Take 1 tablet by mouth daily.      . Cholecalciferol (VITAMIN D3) 5000 UNITS CAPS Take 5,000 Units by mouth every other day.      . furosemide (LASIX) 40 MG tablet Take 40 mg by mouth every morning.      . Magnesium 400 MG CAPS Take 800 mg by mouth 3 (three) times daily.      . ondansetron (ZOFRAN-ODT) 8 MG disintegrating tablet Take 1 tablet (8 mg total) by mouth every 8 (eight) hours as needed for nausea.  20 tablet  0  . OVER THE COUNTER MEDICATION Take 1 tablet by mouth every morning. Slow release iron      . pantoprazole (PROTONIX) 40 MG tablet Take 1 tablet (40 mg total) by mouth 2 (two) times daily before a meal.  90 tablet  2  . polyethylene glycol  (MIRALAX / GLYCOLAX) packet Take 17 g by mouth daily.      . potassium chloride SA (K-DUR,KLOR-CON) 20 MEQ tablet Take 20 mEq by mouth at bedtime.       No current facility-administered medications on file prior to visit.    ROS: all negative expect above.   Physical: Filed Weights   04/09/14 1129  Weight: 239 lb 9.6 oz (108.682 kg)   BP 128/76  Pulse 76  Temp(Src) 97.4 F (36.3 C) (Temporal)  Resp 16  Ht 5\' 4"  (1.626 m)  Wt 239 lb 9.6 oz (108.682 kg)  BMI 41.11 kg/m2 Constitutional: She is oriented to person, place, and time. She appears well-developed and well-nourished. No distress.  obese  HENT:  Head: Normocephalic and atraumatic.  Right Ear: External ear normal.  Left Ear: External ear normal.  Mouth/Throat: Oropharynx is clear and moist.  Eyes: Conjunctivae and EOM are normal. Pupils are equal, round, and reactive to light.  Neck: Normal range of motion. Neck supple.  Cardiovascular: Normal rate and normal pulses.  An irregularly irregular rhythm present.  Pulmonary/Chest: Effort normal and breath sounds normal. She has  no wheezes.  Abdominal: Soft. Bowel sounds are normal. There is no tenderness.  obese  Musculoskeletal: Normal range of motion.  Slow, careful gait  Lymphadenopathy:    She has no cervical adenopathy.  Neurological: She is alert and oriented to person, place, and time. No cranial nerve deficit.  Skin: Skin is warm and dry.  Psychiatric: She has a normal mood and affect. Her behavior is normal. Thought content normal.   Assessment and Plan: Dizziness- resolved, recheck BMP  Seizures- cont keppra level the same.

## 2014-04-09 NOTE — Patient Instructions (Signed)

## 2014-05-25 DIAGNOSIS — R569 Unspecified convulsions: Secondary | ICD-10-CM

## 2014-05-25 HISTORY — DX: Unspecified convulsions: R56.9

## 2014-05-30 ENCOUNTER — Telehealth: Payer: Self-pay

## 2014-05-30 NOTE — Telephone Encounter (Signed)
Return call to patient after hours on 05-29-14 5:35 pm, patient complains of a  Mole that she scratched and its irritated, wanted instructions as to what to do about it, she has a appointment on the 17th and this is the soonest we have available, per Vicie Mutters, PA advised patient to keep clean with soap and water , can use neosporin if she likes and keep the appointment on the 17th, she verbalized understanding and will call sooner if needed

## 2014-06-03 ENCOUNTER — Telehealth: Payer: Self-pay | Admitting: *Deleted

## 2014-06-03 NOTE — Telephone Encounter (Signed)
Spouse called and asked which anti-diarrhea med would be OK while taking blood thinner.  Per Dr Melford Aase, she can take Immodium 1-2 tabs every 1-2 hours PRN for diarrhea max of 12/day.

## 2014-06-10 ENCOUNTER — Encounter: Payer: Self-pay | Admitting: Physician Assistant

## 2014-06-10 ENCOUNTER — Ambulatory Visit (INDEPENDENT_AMBULATORY_CARE_PROVIDER_SITE_OTHER): Payer: Medicare Other | Admitting: Physician Assistant

## 2014-06-10 ENCOUNTER — Ambulatory Visit: Payer: Self-pay | Admitting: Internal Medicine

## 2014-06-10 VITALS — BP 126/68 | HR 74 | Temp 98.2°F | Resp 16 | Ht 64.0 in | Wt 240.0 lb

## 2014-06-10 DIAGNOSIS — Z0000 Encounter for general adult medical examination without abnormal findings: Secondary | ICD-10-CM

## 2014-06-10 DIAGNOSIS — D6832 Hemorrhagic disorder due to extrinsic circulating anticoagulants: Secondary | ICD-10-CM

## 2014-06-10 DIAGNOSIS — I482 Chronic atrial fibrillation, unspecified: Secondary | ICD-10-CM

## 2014-06-10 DIAGNOSIS — Z1331 Encounter for screening for depression: Secondary | ICD-10-CM

## 2014-06-10 DIAGNOSIS — R7309 Other abnormal glucose: Secondary | ICD-10-CM

## 2014-06-10 DIAGNOSIS — E559 Vitamin D deficiency, unspecified: Secondary | ICD-10-CM

## 2014-06-10 DIAGNOSIS — Z79899 Other long term (current) drug therapy: Secondary | ICD-10-CM

## 2014-06-10 DIAGNOSIS — Z9181 History of falling: Secondary | ICD-10-CM

## 2014-06-10 DIAGNOSIS — T45515A Adverse effect of anticoagulants, initial encounter: Secondary | ICD-10-CM

## 2014-06-10 DIAGNOSIS — E785 Hyperlipidemia, unspecified: Secondary | ICD-10-CM

## 2014-06-10 DIAGNOSIS — Z23 Encounter for immunization: Secondary | ICD-10-CM

## 2014-06-10 DIAGNOSIS — E039 Hypothyroidism, unspecified: Secondary | ICD-10-CM

## 2014-06-10 DIAGNOSIS — I10 Essential (primary) hypertension: Secondary | ICD-10-CM

## 2014-06-10 MED ORDER — CYCLOBENZAPRINE HCL 10 MG PO TABS: 10.0000 mg | ORAL_TABLET | Freq: Three times a day (TID) | ORAL | Status: DC | PRN

## 2014-06-10 MED ORDER — CYCLOBENZAPRINE HCL 10 MG PO TABS: 10.0000 mg | ORAL_TABLET | Freq: Three times a day (TID) | ORAL | Status: AC | PRN

## 2014-06-10 NOTE — Patient Instructions (Addendum)
If your morning sugar is always below 100 then the issue is with your sugar spiking after meals. Try to take your blood sugar approximately 2 hours after eating, this number should be less than 200. If it is not, think about the foods that you ate and better choices you can make.    Your A1C is a measure of your sugar over the past 3 months and is not affected by what you have eaten over the past few days. Diabetes increases your chances of stroke and heart attack over 300 % and is the leading cause of blindness and kidney failure in the [place]. Please make sure you decrease bad carbs like white bread, white rice, potatoes, corn, soft drinks, pasta, cereals, refined sugars, sweet tea, dried fruits, and fruit juice. Good carbs are okay to eat in moderation like sweet potatoes, brown rice, whole grain pasta/bread, most fruit (except dried fruit) and you can eat as many veggies as you want.   Greater than 6.5 is considered diabetic. Between 6.4 and 5.7 is prediabetic If your A1C is less than 5.7 you are NOT diabetic.  Preventative Care for Adults - Female      MAINTAIN REGULAR HEALTH EXAMS:  A routine yearly physical is a good way to check in with your primary care provider about your health and preventive screening. It is also an opportunity to share updates about your health and any concerns you have, and receive a thorough all-over exam.   Most health insurance companies pay for at least some preventative services.  Check with your health plan for specific coverages.  WHAT PREVENTATIVE SERVICES DO WOMEN NEED?  Adult women should have their weight and blood pressure checked regularly.   Women age 31 and older should have their cholesterol levels checked regularly.  Women should be screened for cervical cancer with a Pap smear and pelvic exam beginning at either age 60, or 3 years after they become sexually activity.    Breast cancer screening generally begins at age 60 with a mammogram  and breast exam by your primary care provider.    Beginning at age 62 and continuing to age 98, women should be screened for colorectal cancer.  Certain people may need continued testing until age 21.  Updating vaccinations is part of preventative care.  Vaccinations help protect against diseases such as the flu.  Osteoporosis is a disease in which the bones lose minerals and strength as we age. Women ages 36 and over should discuss this with their caregivers, as should women after menopause who have other risk factors.  Lab tests are generally done as part of preventative care to screen for anemia and blood disorders, to screen for problems with the kidneys and liver, to screen for bladder problems, to check blood sugar, and to check your cholesterol level.  Preventative services generally include counseling about diet, exercise, avoiding tobacco, drugs, excessive alcohol consumption, and sexually transmitted infections.    GENERAL RECOMMENDATIONS FOR GOOD HEALTH:  Healthy diet:  Eat a variety of foods, including fruit, vegetables, animal or vegetable protein, such as meat, fish, chicken, and eggs, or beans, lentils, tofu, and grains, such as rice.  Drink plenty of water daily.  Decrease saturated fat in the diet, avoid lots of red meat, processed foods, sweets, fast foods, and fried foods.  Exercise:  Aerobic exercise helps maintain good heart health. At least 30-40 minutes of moderate-intensity exercise is recommended. For example, a brisk walk that increases your heart rate and  breathing. This should be done on most days of the week.   Find a type of exercise or a variety of exercises that you enjoy so that it becomes a part of your daily life.  Examples are running, walking, swimming, water aerobics, and biking.  For motivation and support, explore group exercise such as aerobic class, spin class, Zumba, Yoga,or  martial arts, etc.    Set exercise goals for yourself, such as a  certain weight goal, walk or run in a race such as a 5k walk/run.  Speak to your primary care provider about exercise goals.  Disease prevention:  If you smoke or chew tobacco, find out from your caregiver how to quit. It can literally save your life, no matter how long you have been a tobacco user. If you do not use tobacco, never begin.   Maintain a healthy diet and normal weight. Increased weight leads to problems with blood pressure and diabetes.   The Body Mass Index or BMI is a way of measuring how much of your body is fat. Having a BMI above 27 increases the risk of heart disease, diabetes, hypertension, stroke and other problems related to obesity. Your caregiver can help determine your BMI and based on it develop an exercise and dietary program to help you achieve or maintain this important measurement at a healthful level.  High blood pressure causes heart and blood vessel problems.  Persistent high blood pressure should be treated with medicine if weight loss and exercise do not work.   Fat and cholesterol leaves deposits in your arteries that can block them. This causes heart disease and vessel disease elsewhere in your body.  If your cholesterol is found to be high, or if you have heart disease or certain other medical conditions, then you may need to have your cholesterol monitored frequently and be treated with medication.   Ask if you should have a cardiac stress test if your history suggests this. A stress test is a test done on a treadmill that looks for heart disease. This test can find disease prior to there being a problem.  Menopause can be associated with physical symptoms and risks. Hormone replacement therapy is available to decrease these. You should talk to your caregiver about whether starting or continuing to take hormones is right for you.   Osteoporosis is a disease in which the bones lose minerals and strength as we age. This can result in serious bone fractures.  Risk of osteoporosis can be identified using a bone density scan. Women ages 70 and over should discuss this with their caregivers, as should women after menopause who have other risk factors. Ask your caregiver whether you should be taking a calcium supplement and Vitamin D, to reduce the rate of osteoporosis.   Avoid drinking alcohol in excess (more than two drinks per day).  Avoid use of street drugs. Do not share needles with anyone. Ask for professional help if you need assistance or instructions on stopping the use of alcohol, cigarettes, and/or drugs.  Brush your teeth twice a day with fluoride toothpaste, and floss once a day. Good oral hygiene prevents tooth decay and gum disease. The problems can be painful, unattractive, and can cause other health problems. Visit your dentist for a routine oral and dental check up and preventive care every 6-12 months.   Look at your skin regularly.  Use a mirror to look at your back. Notify your caregivers of changes in moles, especially if there are  changes in shapes, colors, a size larger than a pencil eraser, an irregular border, or development of new moles.  Safety:  Use seatbelts 100% of the time, whether driving or as a passenger.  Use safety devices such as hearing protection if you work in environments with loud noise or significant background noise.  Use safety glasses when doing any work that could send debris in to the eyes.  Use a helmet if you ride a bike or motorcycle.  Use appropriate safety gear for contact sports.  Talk to your caregiver about gun safety.  Use sunscreen with a SPF (or skin protection factor) of 15 or greater.  Lighter skinned people are at a greater risk of skin cancer. Don't forget to also wear sunglasses in order to protect your eyes from too much damaging sunlight. Damaging sunlight can accelerate cataract formation.   Practice safe sex. Use condoms. Condoms are used for birth control and to help reduce the spread of  sexually transmitted infections (or STIs).  Some of the STIs are gonorrhea (the clap), chlamydia, syphilis, trichomonas, herpes, HPV (human papilloma virus) and HIV (human immunodeficiency virus) which causes AIDS. The herpes, HIV and HPV are viral illnesses that have no cure. These can result in disability, cancer and death.   Keep carbon monoxide and smoke detectors in your home functioning at all times. Change the batteries every 6 months or use a model that plugs into the wall.   Vaccinations:  Stay up to date with your tetanus shots and other required immunizations. You should have a booster for tetanus every 10 years. Be sure to get your flu shot every year, since 5%-20% of the [place] population comes down with the flu. The flu vaccine changes each year, so being vaccinated once is not enough. Get your shot in the fall, before the flu season peaks.   Other vaccines to consider:  Human Papilloma Virus or HPV causes cancer of the cervix, and other infections that can be transmitted from person to person. There is a vaccine for HPV, and females should get immunized between the ages of 54 and 104. It requires a series of 3 shots.   Pneumococcal vaccine to protect against certain types of pneumonia.  This is normally recommended for adults age 65 or older.  However, adults younger than 78 years old with certain underlying conditions such as diabetes, heart or lung disease should also receive the vaccine.  Shingles vaccine to protect against Varicella Zoster if you are older than age 69, or younger than 78 years old with certain underlying illness.  Hepatitis A vaccine to protect against a form of infection of the liver by a virus acquired from food.  Hepatitis B vaccine to protect against a form of infection of the liver by a virus acquired from blood or body fluids, particularly if you work in health care.  If you plan to travel internationally, check with your local health department for specific  vaccination recommendations.  Cancer Screening:  Breast cancer screening is essential to preventive care for women. All women age 101 and older should perform a breast self-exam every month. At age 67 and older, women should have their caregiver complete a breast exam each year. Women at ages 36 and older should have a mammogram (x-ray film) of the breasts. Your caregiver can discuss how often you need mammograms.    Cervical cancer screening includes taking a Pap smear (sample of cells examined under a microscope) from the cervix (end of the  uterus). It also includes testing for HPV (Human Papilloma Virus, which can cause cervical cancer). Screening and a pelvic exam should begin at age 60, or 3 years after a woman becomes sexually active. Screening should occur every year, with a Pap smear but no HPV testing, up to age 36. After age 4, you should have a Pap smear every 3 years with HPV testing, if no HPV was found previously.   Most routine colon cancer screening begins at the age of 71. On a yearly basis, doctors may provide special easy to use take-home tests to check for hidden blood in the stool. Sigmoidoscopy or colonoscopy can detect the earliest forms of colon cancer and is life saving. These tests use a small camera at the end of a tube to directly examine the colon. Speak to your caregiver about this at age 59, when routine screening begins (and is repeated every 5 years unless early forms of pre-cancerous polyps or small growths are found).

## 2014-06-10 NOTE — Progress Notes (Signed)
MEDICARE ANNUAL WELLNESS VISIT AND FOLLOW UP  Assessment:   1. Essential hypertension - CBC with Differential - BASIC METABOLIC PANEL WITH GFR - Hepatic function panel  2. Hypothyroidism - TSH  3. Nodule left thigh Schedule removal  4. Hyperlipidemia - Lipid panel  5. PreDiabetes Discussed general issues about diabetes pathophysiology and management., Educational material distributed., Suggested low cholesterol diet., Encouraged aerobic exercise., Discussed foot care., Reminded to get yearly retinal exam. - Hemoglobin A1c - [facility] DIABETES FOOT EXAM  6. Vitamin D Deficiency - Vit D  25 hydroxy (rtn osteoporosis monitoring)  7. Chronic atrial fibrillation Continue elliquis  8. Need for vaccination with 13-polyvalent pneumococcal conjugate vaccine tetanus - Pneumococcal conjugate vaccine 13-valent IM  9. Encounter for long-term (current) use of other medications - Magnesium  Plan:   During the course of the visit the patient was educated and counseled about appropriate screening and preventive services including:    Pneumococcal vaccine   Influenza vaccine  Td vaccine  Screening electrocardiogram  Screening mammography  Bone densitometry screening  Colorectal cancer screening  Diabetes screening  Glaucoma screening  Nutrition counseling   Advanced directives: given info/requested  Screening recommendations, referrals:  Vaccinations: Tdap vaccine ordered Influenza vaccine requested Pneumococcal vaccine due Prevnar 13 Shingles vaccine not indicated Hep B vaccine not indicated  Nutrition assessed and recommended  Colonoscopy Sees Dr. Earlean Shawl Mammogram up to date Pap smear not indicated Pelvic exam not indicated Recommended yearly ophthalmology/optometry visit for glaucoma screening and checkup Recommended yearly dental visit for hygiene and checkup Advanced directives - requested  Conditions/risks identified: BMI: Discussed weight loss, diet,  and increase physical activity.  Increase physical activity: AHA recommends 150 minutes of physical activity a week.  Medications reviewed DEXA- declined Diabetes is at goal, ACE/ARB therapy: Yes. Urinary Incontinence is an issue with stress: discussed non pharmacology and pharmacology options.  Fall risk: moderate- discussed PT, home fall assessment, medications.    Subjective:   [name] is a 80 y.o. female who presents for Medicare Annual Wellness Visit and 3 month follow up on hypertension, prediabetes hyperlipidemia, vitamin D def.  Date of last medicare wellness visit is unknown.   Her blood pressure has been controlled at home, today their BP is BP: 126/68 mmHg She does not workout. She denies chest pain, shortness of breath, dizziness.  She is on cholesterol medication and denies myalgias. Her cholesterol is at goal. The cholesterol last visit was:   Lab Results  Component Value Date   CHOL 174 02/28/2014   HDL 54 02/28/2014   LDLCALC 90 02/28/2014   TRIG 149 02/28/2014   CHOLHDL 3.2 02/28/2014   She has been working on diet and exercise for prediabetes, and denies polydipsia and polyuria. Last A1C in the office was:  Lab Results  Component Value Date   HGBA1C 6.1* 02/28/2014   Patient is on Vitamin D supplement. Lab Results  Component Value Date   VD25OH 42* 02/28/2014     She is on thyroid medication. Her medication was not changed last visit. Patient denies nervousness, palpitations and weight changes.  Lab Results  Component Value Date   TSH 2.977 02/28/2014  .  She has OSA but is unable to tolerate CPAP.  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 She was on coumadin but was recently switched to eliquis, she follows with Dr. Tamala Julian. Complains of wart on left lateral thigh, getting larger, bleeding, and wants removed. Her lower back has been hurting and "  went out of her" a week ago with pain down her left leg. She has had flexeril and takes this occ for her  back, she declines an infection or a follow up.  She is on potassium and mag supplements and would like to decrease her potassium.   Names of Other Physician/Practitioners you currently use: 1.  [facility]- here for primary care 2. Dr. Delman Cheadle, eye doctor, last visit 04/2014 Patient Care Team: Unk Pinto, MD as PCP - General (Internal Medicine) Jeryl Columbia, MD as Consulting Physician (Gastroenterology) Johna Sheriff, MD as Consulting Physician (Ophthalmology) Sinclair Grooms, MD as Consulting Physician (Cardiology) Arta Silence, MD as Consulting Physician (Gastroenterology)  Medication Review Current Outpatient Prescriptions on File Prior to Visit  Medication Sig Dispense Refill  . apixaban (ELIQUIS) 5 MG TABS tablet Take 1 tablet (5 mg total) by mouth 2 (two) times daily.  180 tablet  4  . b complex vitamins tablet Take 1 tablet by mouth daily.      . bisoprolol-hydrochlorothiazide (ZIAC) 5-6.25 MG per tablet Take 1 tablet by mouth every morning.  90 tablet  3  . Cholecalciferol (VITAMIN D3) 5000 UNITS CAPS Take 5,000 Units by mouth every other day.      . furosemide (LASIX) 40 MG tablet Take 40 mg by mouth every morning.      . levETIRAcetam (KEPPRA) 500 MG tablet Take 1 tablet (500 mg total) by mouth 3 (three) times daily.  270 tablet  3  . levothyroxine (SYNTHROID, LEVOTHROID) 88 MCG tablet Take 1 tablet (88 mcg total) by mouth every morning.  90 tablet  3  . Magnesium 400 MG CAPS Take 800 mg by mouth 3 (three) times daily.      . ondansetron (ZOFRAN-ODT) 8 MG disintegrating tablet Take 1 tablet (8 mg total) by mouth every 8 (eight) hours as needed for nausea.  20 tablet  0  . OVER THE COUNTER MEDICATION Take 1 tablet by mouth every morning. Slow release iron      . pantoprazole (PROTONIX) 40 MG tablet Take 1 tablet (40 mg total) by mouth 2 (two) times daily before a meal.  90 tablet  2  . polyethylene glycol (MIRALAX / GLYCOLAX) packet  Take 17 g by mouth daily.      . potassium chloride SA (K-DUR,KLOR-CON) 20 MEQ tablet Take 20 mEq by mouth at bedtime.      Marland Kitchen rOPINIRole (REQUIP) 2 MG tablet Take 1 tablet (2 mg total) by mouth at bedtime.  90 tablet  3  . rosuvastatin (CRESTOR) 40 MG tablet Take 1 tablet (40 mg total) by mouth daily.  90 tablet  3   No current facility-administered medications on file prior to visit.    Current Problems (verified) Patient Active Problem List   Diagnosis Date Noted  . Vitamin D Deficiency 02/28/2014  . Encounter for long-term (current) use of other medications 02/28/2014  . Chronic anticoagulation 02/01/2014  . Hypertension   . Hyperlipidemia   . PreDiabetes   . SAH (subarachnoid hemorrhage)   . RLS (restless legs syndrome)   . Seizures   . Warfarin-induced coagulopathy 07/03/2012  . A-fib 07/03/2012  . Diastolic CHF, chronic 84/16/6063  . Seizure disorder 07/03/2012  . Hypothyroidism 07/03/2012    Screening Tests Health Maintenance  Topic Date Due  . Foot Exam  08/24/1941  . Ophthalmology Exam  08/24/1941  . Tetanus/tdap  10/25/2013  . Influenza Vaccine  05/25/2014  . Hemoglobin A1c  08/31/2014  .  Urine Microalbumin  03/01/2015  . Colonoscopy  05/25/2021  . Pneumococcal Polysaccharide Vaccine Age 45 And Over  Completed  . Zostavax  Completed     Immunization History  Administered Date(s) Administered  . Influenza-Unspecified 08/09/2013  . Pneumococcal-Unspecified 10/25/2004  . Td 10/26/2003  . Zoster 10/26/2007    Preventative care: Last colonoscopy: 2010 due this year EGD: 2012 Last mammogram: 04/2014 Last pap smear/pelvic exam: remote  DEXA:2014 neg CXR 12/2012   Prior vaccinations: TD or Tdap: 2005 due  Influenza: 2014 Pneumococcal: 2006 Prevnar 13: DUE Shingles/Zostavax: 2009  History reviewed: allergies, current medications, past family history, past medical history, past social history, past surgical history and problem list  Risk  Factors: Osteoporosis: postmenopausal estrogen deficiency and dietary calcium and/or vitamin D deficiency History of fracture in the past year: no  Tobacco History  Substance Use Topics  . Smoking status: Former Smoker    Types: Cigarettes    Quit date: 10/25/1994  . Smokeless tobacco: Never Used  . Alcohol Use: No   She does not smoke.  Patient is a former smoker. Are there smokers in your home (other than you)?  No  Alcohol Current alcohol use: none  Caffeine Current caffeine use: coffee 2 /day  Exercise Current exercise: bicycling  Nutrition/Diet Current diet: in general, an "unhealthy" diet  Cardiac risk factors: advanced age (older than 43 for men, 72 for women), diabetes mellitus, dyslipidemia, hypertension, obesity (BMI >= 30 kg/m2) and sedentary lifestyle.  Depression Screen (Note: if answer to either of the following is "Yes", a more complete depression screening is indicated)   Q1: Over the past two weeks, have you felt down, depressed or hopeless? No  Q2: Over the past two weeks, have you felt little interest or pleasure in doing things? No  Have you lost interest or pleasure in daily life? No  Do you often feel hopeless? No  Do you cry easily over simple problems? No  Activities of Daily Living In your present state of health, do you have any difficulty performing the following activities?:  Driving? Yes Managing money?  No Feeding yourself? No Getting from bed to chair? No Climbing a flight of stairs? Yes Preparing food and eating?: No Bathing or showering? No Getting dressed: No Getting to the toilet? No Using the toilet:No Moving around from place to place: Yes In the past year have you fallen or had a near fall?:No   Are you sexually active?  Yes  Do you have more than one partner?  No  Vision Difficulties: No  Hearing Difficulties: No Do you often ask people to speak up or repeat themselves? No Do you experience ringing or noises in your  ears? No Do you have difficulty understanding soft or whispered voices? No  Cognition  Do you feel that you have a problem with memory?Yes  Do you often misplace items? No  Do you feel safe at home?  Yes  Advanced directives Does patient have a Long Neck? Yes Does patient have a Living Will? Yes   Objective:   Blood pressure 126/68, pulse 74, temperature 98.2 F (36.8 C), temperature source Temporal, resp. rate 16, height 5\' 4"  (1.626 m), weight 240 lb (108.863 kg). Body mass index is 41.18 kg/(m^2).  General appearance: alert, no distress, WD/WN,  female Cognitive Testing  Alert? Yes  Normal Appearance?Yes  Oriented to person? Yes  Place? Yes   Time? Yes  Recall of three objects?  Yes  Can perform simple calculations?  Yes  Displays appropriate judgment?Yes  Can read the correct time from a watch face?Yes  HEENT: normocephalic, sclerae anicteric, TMs pearly, nares patent, no discharge or erythema, pharynx normal Oral cavity: MMM, no lesions Neck: supple, no lymphadenopathy, no thyromegaly, no masses Heart: RRR, normal S1, S2, no murmurs Lungs: CTA bilaterally, no wheezes, rhonchi, or rales Abdomen: +bs, soft, obese, non tender, non distended, no masses, no hepatomegaly, no splenomegaly Musculoskeletal: nontender, no swelling, no obvious deformity Extremities: no edema, no cyanosis, no clubbing Pulses: 2+ symmetric, upper and lower extremities, normal cap refill Neurological: alert, oriented x 3, CN2-12 intact, strength normal upper extremities and lower extremities, sensation normal throughout, DTRs 2+ throughout, no cerebellar signs, gait unsteady walks with walker Skin: left lateral thigh with large nodule with erythematous base, scaly, will bleed occ Psychiatric: normal affect, behavior normal, pleasant  Breast: defer Gyn: defer Rectal: defer  Medicare Attestation I have personally reviewed: The patient's medical and social history Their use of  alcohol, tobacco or illicit drugs Their current medications and supplements The patient's functional ability including ADLs,fall risks, home safety risks, cognitive, and hearing and visual impairment Diet and physical activities Evidence for depression or mood disorders  The patient's weight, height, BMI, and visual acuity have been recorded in the chart.  I have made referrals, counseling, and provided education to the patient based on review of the above and I have provided the patient with a written personalized care plan for preventive services.     Vicie Mutters, PA-C   06/10/2014

## 2014-06-11 LAB — CBC WITH DIFFERENTIAL/PLATELET
BASOS PCT: 0 % (ref 0–1)
Basophils Absolute: 0 10*3/uL (ref 0.0–0.1)
Eosinophils Absolute: 0.2 10*3/uL (ref 0.0–0.7)
Eosinophils Relative: 2 % (ref 0–5)
HCT: 42.4 % (ref 36.0–46.0)
Hemoglobin: 14.8 g/dL (ref 12.0–15.0)
Lymphocytes Relative: 29 % (ref 12–46)
Lymphs Abs: 2.8 10*3/uL (ref 0.7–4.0)
MCH: 30.1 pg (ref 26.0–34.0)
MCHC: 34.9 g/dL (ref 30.0–36.0)
MCV: 86.2 fL (ref 78.0–100.0)
MONOS PCT: 8 % (ref 3–12)
Monocytes Absolute: 0.8 10*3/uL (ref 0.1–1.0)
NEUTROS ABS: 5.9 10*3/uL (ref 1.7–7.7)
NEUTROS PCT: 61 % (ref 43–77)
Platelets: 290 10*3/uL (ref 150–400)
RBC: 4.92 MIL/uL (ref 3.87–5.11)
RDW: 14.1 % (ref 11.5–15.5)
WBC: 9.7 10*3/uL (ref 4.0–10.5)

## 2014-06-11 LAB — HEPATIC FUNCTION PANEL
ALT: 13 U/L (ref 0–35)
AST: 20 U/L (ref 0–37)
Albumin: 4.2 g/dL (ref 3.5–5.2)
Alkaline Phosphatase: 64 U/L (ref 39–117)
BILIRUBIN DIRECT: 0.1 mg/dL (ref 0.0–0.3)
BILIRUBIN TOTAL: 0.5 mg/dL (ref 0.2–1.2)
Indirect Bilirubin: 0.4 mg/dL (ref 0.2–1.2)
Total Protein: 6.9 g/dL (ref 6.0–8.3)

## 2014-06-11 LAB — BASIC METABOLIC PANEL WITH GFR
BUN: 15 mg/dL (ref 6–23)
CO2: 31 mEq/L (ref 19–32)
Calcium: 9.3 mg/dL (ref 8.4–10.5)
Chloride: 96 mEq/L (ref 96–112)
Creat: 1.08 mg/dL (ref 0.50–1.10)
GFR, EST AFRICAN AMERICAN: 56 mL/min — AB
GFR, EST NON AFRICAN AMERICAN: 49 mL/min — AB
GLUCOSE: 88 mg/dL (ref 70–99)
POTASSIUM: 3.5 meq/L (ref 3.5–5.3)
SODIUM: 141 meq/L (ref 135–145)

## 2014-06-11 LAB — VITAMIN D 25 HYDROXY (VIT D DEFICIENCY, FRACTURES): Vit D, 25-Hydroxy: 92 ng/mL — ABNORMAL HIGH (ref 30–89)

## 2014-06-11 LAB — HEMOGLOBIN A1C
Hgb A1c MFr Bld: 6.4 % — ABNORMAL HIGH (ref <5.7)
MEAN PLASMA GLUCOSE: 137 mg/dL — AB (ref <117)

## 2014-06-11 LAB — LIPID PANEL
CHOLESTEROL: 163 mg/dL (ref 0–200)
HDL: 50 mg/dL (ref >39)
LDL Cholesterol: 78 mg/dL (ref 0–99)
Total CHOL/HDL Ratio: 3.3 Ratio
Triglycerides: 176 mg/dL — ABNORMAL HIGH (ref <150)
VLDL: 35 mg/dL (ref 0–40)

## 2014-06-11 LAB — MAGNESIUM: Magnesium: 1.8 mg/dL (ref 1.5–2.5)

## 2014-06-11 LAB — TSH: TSH: 3.907 u[IU]/mL (ref 0.350–4.500)

## 2014-06-17 ENCOUNTER — Ambulatory Visit (INDEPENDENT_AMBULATORY_CARE_PROVIDER_SITE_OTHER): Payer: Medicare Other | Admitting: Internal Medicine

## 2014-06-17 ENCOUNTER — Encounter: Payer: Self-pay | Admitting: Internal Medicine

## 2014-06-17 VITALS — BP 116/78 | HR 72 | Temp 97.2°F | Resp 18 | Ht 64.0 in | Wt 241.2 lb

## 2014-06-17 DIAGNOSIS — M5430 Sciatica, unspecified side: Secondary | ICD-10-CM

## 2014-06-17 DIAGNOSIS — M5442 Lumbago with sciatica, left side: Secondary | ICD-10-CM

## 2014-06-17 MED ORDER — HYDROCODONE-ACETAMINOPHEN 5-325 MG PO TABS: ORAL_TABLET | ORAL | Status: DC

## 2014-06-17 MED ORDER — PREDNISONE 20 MG PO TABS: ORAL_TABLET | ORAL | Status: DC

## 2014-06-17 NOTE — Patient Instructions (Signed)
Sciatica Sciatica is pain, weakness, numbness, or tingling along the path of the sciatic nerve. The nerve starts in the lower back and runs down the back of each leg. The nerve controls the muscles in the lower leg and in the back of the knee, while also providing sensation to the back of the thigh, lower leg, and the sole of your foot. Sciatica is a symptom of another medical condition. For instance, nerve damage or certain conditions, such as a herniated disk or bone spur on the spine, pinch or put pressure on the sciatic nerve. This causes the pain, weakness, or other sensations normally associated with sciatica. Generally, sciatica only affects one side of the body. CAUSES   Herniated or slipped disc.  Degenerative disk disease.  A pain disorder involving the narrow muscle in the buttocks (piriformis syndrome).  Pelvic injury or fracture.  Pregnancy.  Tumor (rare). SYMPTOMS  Symptoms can vary from mild to very severe. The symptoms usually travel from the low back to the buttocks and down the back of the leg. Symptoms can include:  Mild tingling or dull aches in the lower back, leg, or hip.  Numbness in the back of the calf or sole of the foot.  Burning sensations in the lower back, leg, or hip.  Sharp pains in the lower back, leg, or hip.  Leg weakness.  Severe back pain inhibiting movement. These symptoms may get worse with coughing, sneezing, laughing, or prolonged sitting or standing. Also, being overweight may worsen symptoms. DIAGNOSIS  Your caregiver will perform a physical exam to look for common symptoms of sciatica. He or she may ask you to do certain movements or activities that would trigger sciatic nerve pain. Other tests may be performed to find the cause of the sciatica. These may include:  Blood tests.  X-rays.  Imaging tests, such as an MRI or CT scan. TREATMENT  Treatment is directed at the cause of the sciatic pain. Sometimes, treatment is not necessary  and the pain and discomfort goes away on its own. If treatment is needed, your caregiver may suggest:  Over-the-counter medicines to relieve pain.  Prescription medicines, such as anti-inflammatory medicine, muscle relaxants, or narcotics.  Applying heat or ice to the painful area.  Steroid injections to lessen pain, irritation, and inflammation around the nerve.  Reducing activity during periods of pain.  Exercising and stretching to strengthen your abdomen and improve flexibility of your spine. Your caregiver may suggest losing weight if the extra weight makes the back pain worse.  Physical therapy.  Surgery to eliminate what is pressing or pinching the nerve, such as a bone spur or part of a herniated disk. HOME CARE INSTRUCTIONS   Only take over-the-counter or prescription medicines for pain or discomfort as directed by your caregiver.  Apply ice to the affected area for 20 minutes, 3-4 times a day for the first 48-72 hours. Then try heat in the same way.  Exercise, stretch, or perform your usual activities if these do not aggravate your pain.  Attend physical therapy sessions as directed by your caregiver.  Keep all follow-up appointments as directed by your caregiver.  Do not wear high heels or shoes that do not provide proper support.  Check your mattress to see if it is too soft. A firm mattress may lessen your pain and discomfort. SEEK IMMEDIATE MEDICAL CARE IF:   You lose control of your bowel or bladder (incontinence).  You have increasing weakness in the lower back, pelvis, buttocks,   or legs.  You have redness or swelling of your back.  You have a burning sensation when you urinate.  You have pain that gets worse when you lie down or awakens you at night.  Your pain is worse than you have experienced in the past.  Your pain is lasting longer than 4 weeks.  You are suddenly losing weight without reason. MAKE SURE YOU:  Understand these  instructions.  Will watch your condition.  Will get help right away if you are not doing well or get worse. Document Released: 10/05/2001 Document Revised: 04/11/2012 Document Reviewed: 02/20/2012 [employer] Patient Information 2015 [employer]. This information is not intended to replace advice given to you by your health care provider. Make sure you discuss any questions you have with your health care provider.  

## 2014-06-17 NOTE — Progress Notes (Signed)
   Subjective:    Patient ID: [name], female    DOB: 04-25-1931, 80 y.o.   MRN: [id number]  HPI  Patient presents with c/o Left LBP and left sciatica for about 1 week w/o any precipitating event. She denies any issues with urinary retention or foot drop. She does report some numbness of the left 4th & 5th toes.  Meds, All, PMHx unchanged.  Review of Systems In addition to the HPI above,  No Fever-chills,  No Headache, No changes with Vision or hearing,  No problems swallowing food or Liquids,  No Chest pain or productive Cough or Shortness of Breath,  No Abdominal pain, No Nausea or Vomitting, Bowel movements are regular,  No Blood in stool or Urine,  No dysuria,  No new skin rashes or bruises,  No new joints pains-aches,  No recent weight loss,  No polyuria, polydypsia or polyphagia,  No significant Mental Stressors.  A full 10 point Review of Systems was done, except as stated above, all other Review of Systems were negative  Objective:   Physical Exam BP 116/78  Pulse 72  Temp(Src) 97.2 F (36.2 C) (Temporal)  Resp 18  Ht 5\' 4"  (1.626 m)  Wt 241 lb 3.2 oz (109.408 kg)  BMI 41.38 kg/m2  HEENT - Eac's patent. TM's Nl. EOM's full. PERRLA. NasoOroPharynx clear. Neck - supple. Nl Thyroid. Carotids 2+ & No bruits, nodes, JVD Chest - Clear equal BS w/o Rales, rhonchi, wheezes. Cor - Nl HS. RRR w/o sig MGR. PP 1(+). No edema. Abd - No palpable organomegaly, masses or tenderness. BS nl. MS- FROM w/o deformities. Muscle power, tone and bulk Nl. Gait Nl. Tender low left paralumbar spasm. Neuro - No obvious Cr N abnormalities. Sensory, motor and Cerebellar functions appear Nl w/o focal abnormalities. (+) SLR on the left. Psyche - Mental status normal & appropriate.  No delusions, ideations or obvious mood abnormalities.  Assessment & Plan:   1. Left-sided low back pain with left sciatica  - RX Prednisone pulse/taper and Norco- 5 prn p[ain . Has Flexeril to use prn.  Advised to return if Sx's worsen or if develops urinary retention or foot drop.

## 2014-06-27 ENCOUNTER — Telehealth: Payer: Self-pay | Admitting: Interventional Cardiology

## 2014-06-27 NOTE — Telephone Encounter (Signed)
Error

## 2014-07-12 ENCOUNTER — Telehealth: Payer: Self-pay | Admitting: Interventional Cardiology

## 2014-07-12 ENCOUNTER — Telehealth: Payer: Self-pay

## 2014-07-12 NOTE — Telephone Encounter (Signed)
Cardiac clearance given to medical records to fax to [facility] GI attn: Dr.Magod fax [phone]

## 2014-07-12 NOTE — Telephone Encounter (Signed)
Received request from Nurse fax box, documents faxed for surgical clearance. To: Jackson Latino  Fax number: [phone] Attention: 9.18.15/km

## 2014-07-15 ENCOUNTER — Encounter (HOSPITAL_COMMUNITY): Payer: Self-pay | Admitting: Pharmacy Technician

## 2014-07-19 ENCOUNTER — Encounter (HOSPITAL_COMMUNITY): Payer: Self-pay | Admitting: *Deleted

## 2014-07-23 ENCOUNTER — Encounter: Payer: Self-pay | Admitting: Internal Medicine

## 2014-07-23 ENCOUNTER — Other Ambulatory Visit: Payer: Self-pay | Admitting: Internal Medicine

## 2014-07-23 ENCOUNTER — Ambulatory Visit (INDEPENDENT_AMBULATORY_CARE_PROVIDER_SITE_OTHER): Payer: Medicare Other | Admitting: Internal Medicine

## 2014-07-23 VITALS — BP 142/76 | HR 76 | Temp 97.5°F | Resp 16 | Ht 64.0 in | Wt 244.0 lb

## 2014-07-23 DIAGNOSIS — D485 Neoplasm of uncertain behavior of skin: Secondary | ICD-10-CM

## 2014-07-23 DIAGNOSIS — I10 Essential (primary) hypertension: Secondary | ICD-10-CM

## 2014-07-23 DIAGNOSIS — Z23 Encounter for immunization: Secondary | ICD-10-CM

## 2014-07-23 NOTE — Progress Notes (Signed)
   Subjective:    Patient ID: [name], female    DOB: 08/18/1931, 80 y.o.   MRN: [id number]  HPI  Patient presents bor BP recheck and CV systems review is negative.  She also is concerned about a non healing lesion of her lateral mid left thigh. PMH/Meds/All - reviewed.  Review of Systems non contributory otherwise   Objective:   Physical Exam  BP 142/76  Pulse 76  Temp(Src) 97.5 F (36.4 C) (Temporal)  Resp 16  Ht 5\' 4"  (1.626 m)  Wt 244 lb (110.678 kg)  BMI 41.86 kg/m2  Focused exam of the skin finds a  12 x 8 mm pink raised & nodular/indurated lesion with slight ulceration of the lateral mid left thigh.  With informed consent and after analgesia with 2 cc of Marcaine 0.5 % w/epi and std aseptic prep, the lesion was excised in an elliptical excisional full thickness fashion to adipose tissue  with a 2 mm margin along the obvious lesion demarcation of abnormal tissue.  Then the wound edges were undermined and approximated with # 8 vertical mattress sutures with 3-0 Nylon and 4" x 6 " Tegaderm was applied. Lesion was sent for path.  Pt and Husb were informed of wound care.    Assessment & Plan:   1. Unspecified essential hypertension   2. Neoplasm of uncertain behavior of skin- Suspect [facility]  - Dermatology pathology  3. Need for prophylactic vaccination and inoculation against influenza  - Flu vaccine HIGH DOSE PF (Fluzone Tri High dose)   ROV 12 day for suture removal.

## 2014-07-24 ENCOUNTER — Ambulatory Visit: Payer: Medicare Other | Admitting: Internal Medicine

## 2014-07-24 ENCOUNTER — Encounter: Payer: Self-pay | Admitting: Internal Medicine

## 2014-07-24 VITALS — BP 128/68 | HR 80 | Temp 97.7°F | Resp 16 | Ht 64.0 in | Wt 244.0 lb

## 2014-07-24 DIAGNOSIS — S71112D Laceration without foreign body, left thigh, subsequent encounter: Secondary | ICD-10-CM

## 2014-07-24 NOTE — Progress Notes (Signed)
Patient ID: [name], female   DOB: 1931/02/19, 80 y.o.   MRN: [id number]   Patient had excision of a suspected SSC of Lt thigh yesterday and despite Eliquis Tx she had minimal bleedin, but returns today for recheck with a slight amount of blood and wound was sealed with "New Skin" / 8 Hydroxychloroquine 1% to seal the wound and the 4" x 6" Tegaderm applied and 4" ace wrap was also applied to reinforce as a pressure dressing.   Patient to return in 10-11 days or sooner if bleeding reoccurs

## 2014-07-26 ENCOUNTER — Other Ambulatory Visit: Payer: Self-pay | Admitting: Gastroenterology

## 2014-07-26 NOTE — Addendum Note (Signed)
Addended byClarene Essex on: 07/26/2014 10:17 AM   Modules accepted: Orders

## 2014-07-30 ENCOUNTER — Encounter (HOSPITAL_COMMUNITY): Payer: Self-pay | Admitting: Anesthesiology

## 2014-07-30 ENCOUNTER — Ambulatory Visit (HOSPITAL_COMMUNITY): Payer: Medicare Other | Admitting: Anesthesiology

## 2014-07-30 ENCOUNTER — Encounter (HOSPITAL_COMMUNITY)
Admission: RE | Disposition: A | Discharge status: Home / Self Care | Payer: Self-pay | Source: Ambulatory Visit | Attending: Gastroenterology

## 2014-07-30 ENCOUNTER — Ambulatory Visit (HOSPITAL_COMMUNITY)
Admission: RE | Admit: 2014-07-30 | Discharge: 2014-07-30 | Disposition: A | Discharge status: Home / Self Care | Payer: Medicare Other | Source: Ambulatory Visit | Attending: Gastroenterology | Admitting: Gastroenterology

## 2014-07-30 ENCOUNTER — Encounter (HOSPITAL_COMMUNITY): Payer: Medicare Other | Admitting: Anesthesiology

## 2014-07-30 DIAGNOSIS — I509 Heart failure, unspecified: Secondary | ICD-10-CM | POA: Diagnosis not present

## 2014-07-30 DIAGNOSIS — Z87891 Personal history of nicotine dependence: Secondary | ICD-10-CM | POA: Insufficient documentation

## 2014-07-30 DIAGNOSIS — K219 Gastro-esophageal reflux disease without esophagitis: Secondary | ICD-10-CM | POA: Diagnosis not present

## 2014-07-30 DIAGNOSIS — G4730 Sleep apnea, unspecified: Secondary | ICD-10-CM | POA: Insufficient documentation

## 2014-07-30 DIAGNOSIS — E039 Hypothyroidism, unspecified: Secondary | ICD-10-CM | POA: Diagnosis not present

## 2014-07-30 DIAGNOSIS — M1990 Unspecified osteoarthritis, unspecified site: Secondary | ICD-10-CM | POA: Insufficient documentation

## 2014-07-30 DIAGNOSIS — Z8673 Personal history of transient ischemic attack (TIA), and cerebral infarction without residual deficits: Secondary | ICD-10-CM | POA: Insufficient documentation

## 2014-07-30 DIAGNOSIS — I10 Essential (primary) hypertension: Secondary | ICD-10-CM | POA: Diagnosis not present

## 2014-07-30 DIAGNOSIS — K319 Disease of stomach and duodenum, unspecified: Secondary | ICD-10-CM | POA: Diagnosis not present

## 2014-07-30 DIAGNOSIS — K317 Polyp of stomach and duodenum: Secondary | ICD-10-CM | POA: Diagnosis not present

## 2014-07-30 DIAGNOSIS — Z09 Encounter for follow-up examination after completed treatment for conditions other than malignant neoplasm: Secondary | ICD-10-CM | POA: Diagnosis present

## 2014-07-30 HISTORY — DX: Personal history of other medical treatment: Z92.89

## 2014-07-30 HISTORY — PX: HOT HEMOSTASIS: SHX5433

## 2014-07-30 HISTORY — PX: ESOPHAGOGASTRODUODENOSCOPY (EGD) WITH PROPOFOL: SHX5813

## 2014-07-30 SURGERY — ESOPHAGOGASTRODUODENOSCOPY (EGD) WITH PROPOFOL
Anesthesia: Monitor Anesthesia Care

## 2014-07-30 MED ORDER — BUTAMBEN-TETRACAINE-BENZOCAINE 2-2-14 % EX AERO
INHALATION_SPRAY | CUTANEOUS | Status: DC | PRN
  Administered 2014-07-30: 2 via TOPICAL

## 2014-07-30 MED ORDER — PROPOFOL INFUSION 10 MG/ML OPTIME
INTRAVENOUS | Status: DC | PRN
  Administered 2014-07-30: 70 ug/kg/min via INTRAVENOUS

## 2014-07-30 MED ORDER — LACTATED RINGERS IV SOLN
INTRAVENOUS | Status: DC | PRN
  Administered 2014-07-30: 08:00:00 via INTRAVENOUS

## 2014-07-30 MED ORDER — PROPOFOL 10 MG/ML IV BOLUS
INTRAVENOUS | Status: AC
  Filled 2014-07-30: qty 20

## 2014-07-30 MED ORDER — SODIUM CHLORIDE 0.9 % IV SOLN: INTRAVENOUS | Status: DC

## 2014-07-30 SURGICAL SUPPLY — 14 items

## 2014-07-30 NOTE — Anesthesia Preprocedure Evaluation (Addendum)
Anesthesia Evaluation  Patient identified by MRN, date of birth, ID band Patient awake    Reviewed: Allergy & Precautions, H&P , NPO status , Patient's Chart, lab work & pertinent test results  Airway Mallampati: II TM Distance: >3 FB Neck ROM: Full    Dental no notable dental hx.    Pulmonary shortness of breath, sleep apnea , former smoker,  breath sounds clear to auscultation  Pulmonary exam normal       Cardiovascular hypertension, Pt. on medications and Pt. on home beta blockers +CHF Rhythm:Regular Rate:Normal     Neuro/Psych Seizures -,  Occasional right sided weakness CVA, Residual Symptoms negative psych ROS   GI/Hepatic Neg liver ROS, GERD-  ,  Endo/Other  Hypothyroidism Morbid obesity  Renal/GU negative Renal ROS  negative genitourinary   Musculoskeletal  (+) Arthritis -,   Abdominal (+) + obese,   Peds negative pediatric ROS (+)  Hematology  (+) anemia ,   Anesthesia Other Findings   Reproductive/Obstetrics negative OB ROS                          Anesthesia Physical Anesthesia Plan  ASA: III  Anesthesia Plan: MAC   Post-op Pain Management:    Induction: Intravenous  Airway Management Planned:   Additional Equipment:   Intra-op Plan:   Post-operative Plan:   Informed Consent: I have reviewed the patients History and Physical, chart, labs and discussed the procedure including the risks, benefits and alternatives for the proposed anesthesia with the patient or authorized representative who has indicated his/her understanding and acceptance.   Dental advisory given  Plan Discussed with: CRNA  Anesthesia Plan Comments:         Anesthesia Quick Evaluation

## 2014-07-30 NOTE — Transfer of Care (Signed)
Immediate Anesthesia Transfer of Care Note  Patient: [name]  Procedure(s) Performed: Procedure(s): ESOPHAGOGASTRODUODENOSCOPY (EGD) WITH PROPOFOL (N/A) HOT HEMOSTASIS (ARGON PLASMA COAGULATION/BICAP) (N/A)  Patient Location: PACU  Anesthesia Type:MAC  Level of Consciousness: sedated  Airway & Oxygen Therapy: Patient Spontanous Breathing and Patient connected to face mask oxygen  Post-op Assessment: Report given to PACU RN and Post -op Vital signs reviewed and stable  Post vital signs: Reviewed and stable  Complications: No apparent anesthesia complications

## 2014-07-30 NOTE — Op Note (Signed)
[facility] [place], [address]   ENDOSCOPY PROCEDURE REPORT  PATIENT: [name], [name]  MR#: [id number] BIRTHDATE: August 12, 1931 , 82  yrs. old GENDER: female ENDOSCOPIST: Clarene Essex, MD REFERRED BY:  Unk Pinto, M.D. PROCEDURE DATE:  07/30/2014 PROCEDURE:  EGD w/ biopsy and EGD w/ snare polypectomy ASA CLASS:     Class II INDICATIONS:  follow-up of benign gastric tumor. MEDICATIONS: Propofol 200 mg IV TOPICAL ANESTHETIC: Cetacaine Spray  DESCRIPTION OF PROCEDURE: After the risks benefits and alternatives of the procedure were thoroughly explained, informed consent was obtained.  The Pentax Gastroscope [id number] endoscope was introduced through the mouth and advanced to the second portion of the duodenum , Without limitations.  The instrument was slowly withdrawn as the mucosa was fully examined.    the findings are recorded below       Retroflexed views revealed few proximal polyps status post removed or biopsied.     The scope was then withdrawn from the patient and the procedure completed.the patient tolerated the procedure well  COMPLICATIONS: There were no immediate complications.  ENDOSCOPIC IMPRESSION: 1.3 small antral mid greater curve and proximal polyps status post hotsnare and removed        2. A few tiny antral and proximal polyps and MID gastric polyps cold biopsy 3. Mild antritis 4. Otherwise within normal limits EGD  RECOMMENDATIONS: await pathology followup when necessary resume blood thinners in 2 days if no signs of bleeding and repeat exam pending symptoms and final pathology  REPEAT EXAM: as above  eSigned:  Clarene Essex, MD 07/30/2014 8:22 AM    MM:NOTRRNH Melford Aase, MD  CPT CODES: ICD CODES:  The ICD and CPT codes recommended by this software are interpretations from the data that the clinical staff has captured with the software.  The verification of the translation of this report to the ICD and CPT codes  and modifiers is the sole responsibility of the health care institution and practicing physician where this report was generated.  [place]. will not be held responsible for the validity of the ICD and CPT codes included on this report.  AMA assumes no liability for data contained or not contained herein. CPT is a Designer, television/film set of the [employer].  PATIENT NAME:  [name], [name] MR#: [id number]

## 2014-07-30 NOTE — Progress Notes (Signed)
[name] 7:42 AM  Subjective: Patient without any new problems since we've seen her and no GI complaints but did have recent skin cancer removed  Objective: Vital signs stable afebrile no acute distress exam please see pre-assessment evaluation Assessment: Abnormal gastric polyps  Plan: Okay to proceed with endoscopy possible biopsy versus. removal with anesthesia assistance  Elmor Kost E

## 2014-07-30 NOTE — Anesthesia Postprocedure Evaluation (Signed)
  Anesthesia Post-op Note  Patient: [name]  Procedure(s) Performed: Procedure(s) (LRB): ESOPHAGOGASTRODUODENOSCOPY (EGD) WITH PROPOFOL (N/A) HOT HEMOSTASIS (ARGON PLASMA COAGULATION/BICAP) (N/A)  Patient Location: PACU  Anesthesia Type: MAC  Level of Consciousness: awake and alert   Airway and Oxygen Therapy: Patient Spontanous Breathing  Post-op Pain: mild  Post-op Assessment: Post-op Vital signs reviewed, Patient's Cardiovascular Status Stable, Respiratory Function Stable, Patent Airway and No signs of Nausea or vomiting  Last Vitals:  Filed Vitals:   07/30/14 0830  BP: 177/76  Pulse: 73  Resp: 24    Post-op Vital Signs: stable   Complications: No apparent anesthesia complications

## 2014-07-30 NOTE — Discharge Instructions (Addendum)
Call if question or problem otherwise call for biopsy report in one week and followup as needed and resume blood thinner-Ellquis on Thursday if doing well and no signs of bleeding.  Esophagogastroduodenoscopy Care After Refer to this sheet in the next few weeks. These instructions provide you with information on caring for yourself after your procedure. Your caregiver may also give you more specific instructions. Your treatment has been planned according to current medical practices, but problems sometimes occur. Call your caregiver if you have any problems or questions after your procedure.  HOME CARE INSTRUCTIONS  Do not eat or drink anything until the numbing medicine (local anesthetic) has worn off and your gag reflex has returned. You will know that the local anesthetic has worn off when you can swallow comfortably.  Do not drive for 12 hours after the procedure or as directed by your caregiver.  Only take medicines as directed by your caregiver. SEEK MEDICAL CARE IF:   You cannot stop coughing.  You are not urinating at all or less than usual. SEEK IMMEDIATE MEDICAL CARE IF:  You have difficulty swallowing.  You cannot eat or drink.  You have worsening throat or chest pain.  You have dizziness, lightheadedness, or you faint.  You have nausea or vomiting.  You have chills.  You have a fever.  You have severe abdominal pain.  You have black, tarry, or bloody stools. Document Released: 09/27/2012 Document Reviewed: 09/27/2012 [facility] Patient Information 2015 [employer]. This information is not intended to replace advice given to you by your health care provider. Make sure you discuss any questions you have with your health care provider.  Gastric Polyps Gastric polyps (also called stomach polyps) are growths on the inner lining of the stomach. They are clumps of cells that usually do not cause any symptoms. The polyps are often found when medical procedures are being  performed for a different problem.  Gastric polyps are rare. However, several types of polyps can develop. Most are noncancerous (benign). Some are very small and do not require any treatment. Some are potentially harmful because of their size or location. Other types (adenomas) are known to cause complications and may become cancerous if left alone. Polyps that may become harmful are usually removed. Removal is often successful in preventing problems from developing. CAUSES  Gastric polyps form when inflammation or damage to the stomach lining occurs. This may happen because of:  Chronic stomach conditions, such as gastritis.  Use of certain medicines for reducing stomach acid (proton pump inhibitors).  An inherited condition called familial adenomatous polyposis. SYMPTOMS  Gastric polyps usually do not cause any symptoms. In rare cases, they may develop into ulcers or cause blockages. The following symptoms may occur:  Abdominal pain or tenderness.  Nausea.  Trouble eating or swallowing.  Blood in the stool.  Reduced number of red blood cells in the blood (anemia). DIAGNOSIS  Most often, gastric polyps are found when a medical procedure called endoscopy is being performed to look for some other problem in the stomach area. During endoscopy, a thin tube with a tiny camera attached is placed down the throat and into the stomach. If polyps are discovered during endoscopy, your health care provider may:  Examine the polyps closely to determine the type of polyps.  Remove a tissue sample (biopsy) from the polyps for testing in a lab. A medical history will then be taken to assess risks and the possibility of inherited disorders.  TREATMENT  Treatment will depend  on the type, location, and size of the polyps found. Often, treatment is not necessary. In that case, your health care provider may choose to monitor the polyps by performing endoscopy periodically. Polyps that cause symptoms or  are potentially harmful will often be removed during endoscopy. If you have a stomach infection or other gastric condition, you may be given medicines to treat the condition. In rare cases, surgery (partial gastrectomy) may be done to remove very large polyps. HOME CARE INSTRUCTIONS   Only take over-the-counter or prescription medicines as directed by your health care provider. Do not take any other medicines unless your health care provider approves.  If your health care provider prescribes antibiotic medicines, take them as directed. Finish them even if you start to feel better.  Follow up with your health care provider as directed. SEEK MEDICAL CARE IF:   You develop new or worsening symptoms. SEEK IMMEDIATE MEDICAL CARE IF:  You throw up blood.  You develop severe abdominal pain.  You cannot eat or drink.  You notice blood in your stool. Document Released: 09/27/2012 Document Revised: 02/25/2014 Document Reviewed: 09/27/2012 [facility] Patient Information 2015 [place], [place]. This information is not intended to replace advice given to you by your health care provider. Make sure you discuss any questions you have with your health care provider.  Monitored Anesthesia Care Monitored anesthesia care is an anesthesia service for a medical procedure. Anesthesia is the loss of the ability to feel pain. It is produced by medicines called anesthetics. It may affect a small area of your body (local anesthesia), a large area of your body (regional anesthesia), or your entire body (general anesthesia). The need for monitored anesthesia care depends your procedure, your condition, and the potential need for regional or general anesthesia. It is often provided during procedures where:   General anesthesia may be needed if there are complications. This is because you need special care when you are under general anesthesia.   You will be under local or regional anesthesia. This is so that you are  able to have higher levels of anesthesia if needed.   You will receive calming medicines (sedatives). This is especially the case if sedatives are given to put you in a semi-conscious state of relaxation (deep sedation). This is because the amount of sedative needed to produce this state can be hard to predict. Too much of a sedative can produce general anesthesia. Monitored anesthesia care is performed by one or more health care providers who have special training in all types of anesthesia. You will need to meet with these health care providers before your procedure. During this meeting, they will ask you about your medical history. They will also give you instructions to follow. (For example, you will need to stop eating and drinking before your procedure. You may also need to stop or change medicines you are taking.) During your procedure, your health care providers will stay with you. They will:   Watch your condition. This includes watching your blood pressure, breathing, and level of pain.   Diagnose and treat problems that occur.   Give medicines if they are needed. These may include calming medicines (sedatives) and anesthetics.   Make sure you are comfortable.  Having monitored anesthesia care does not necessarily mean that you will be under anesthesia. It does mean that your health care providers will be able to manage anesthesia if you need it or if it occurs. It also means that you will be able to have  a different type of anesthesia than you are having if you need it. When your procedure is complete, your health care providers will continue to watch your condition. They will make sure any medicines wear off before you are allowed to go home.  Document Released: 07/07/2005 Document Revised: 02/25/2014 Document Reviewed: 11/22/2012 [facility] Patient Information 2015 [place], [place]. This information is not intended to replace advice given to you by your health care provider. Make sure  you discuss any questions you have with your health care provider.

## 2014-07-31 ENCOUNTER — Encounter (HOSPITAL_COMMUNITY): Payer: Self-pay | Admitting: Gastroenterology

## 2014-08-05 ENCOUNTER — Ambulatory Visit: Payer: Medicare Other | Admitting: Physician Assistant

## 2014-08-05 VITALS — BP 124/72 | HR 64 | Temp 97.7°F | Resp 16 | Ht 64.0 in | Wt 239.8 lb

## 2014-08-05 DIAGNOSIS — Z4802 Encounter for removal of sutures: Secondary | ICD-10-CM

## 2014-08-05 NOTE — Progress Notes (Signed)
Subjective:    [name] is a 80 y.o. female who had low grade skin cancer removed 09/29 from left upper thigh. She denies pain, redness, or drainage from the wound.   The following portions of the patient's history were reviewed and updated as appropriate: allergies, current medications, past family history, past medical history, past social history, past surgical history and problem list.  Review of Systems Pertinent items are noted in HPI.    Objective:    BP 124/72  Pulse 64  Temp(Src) 97.7 F (36.5 C) (Temporal)  Resp 16  Ht 5\' 4"  (1.626 m)  Wt 239 lb 12.8 oz (108.773 kg)  BMI 41.14 kg/m2  exam:   is healing well, without evidence of infection.    Assessment:    Laceration is healing well, without evidence of infection.    Plan:     1.  sutures were removed. 2. Wound care discussed. 3. Follow up as needed.

## 2014-08-06 ENCOUNTER — Ambulatory Visit: Payer: Self-pay | Admitting: Internal Medicine

## 2014-09-04 ENCOUNTER — Encounter: Payer: Self-pay | Admitting: Internal Medicine

## 2014-09-04 ENCOUNTER — Ambulatory Visit (INDEPENDENT_AMBULATORY_CARE_PROVIDER_SITE_OTHER): Payer: Medicare Other | Admitting: Internal Medicine

## 2014-09-04 VITALS — BP 124/82 | HR 64 | Temp 96.8°F | Resp 18 | Ht 64.0 in | Wt 241.0 lb

## 2014-09-04 DIAGNOSIS — R7309 Other abnormal glucose: Secondary | ICD-10-CM

## 2014-09-04 DIAGNOSIS — E559 Vitamin D deficiency, unspecified: Secondary | ICD-10-CM

## 2014-09-04 DIAGNOSIS — Z7901 Long term (current) use of anticoagulants: Secondary | ICD-10-CM

## 2014-09-04 DIAGNOSIS — Z23 Encounter for immunization: Secondary | ICD-10-CM

## 2014-09-04 DIAGNOSIS — E785 Hyperlipidemia, unspecified: Secondary | ICD-10-CM

## 2014-09-04 DIAGNOSIS — Z79899 Other long term (current) drug therapy: Secondary | ICD-10-CM

## 2014-09-04 DIAGNOSIS — I10 Essential (primary) hypertension: Secondary | ICD-10-CM

## 2014-09-04 NOTE — Progress Notes (Signed)
Patient ID: [name], female   DOB: June 04, 1931, 80 y.o.   MRN: [id number]   This very nice 80 y.o.female presents for 3 month follow up with Hypertension, ASHD and chAfib, Hyperlipidemia, Pre-Diabetes and Vitamin D Deficiency. 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   Patient is treated for HTN & BP has been controlled at home. Today's BP: 124/82 mmHg.Patirent was on coumadin 2010 thru this year when switched to Xarelto. Patient has had no complaints of any cardiac type chest pain, palpitations, dyspnea/orthopnea/PND, dizziness, claudication, or dependent edema.   Hyperlipidemia is controlled with diet & meds. Patient denies myalgias or other med SE's. Last Lipids were  at goal with Total  Chol 163; HDL  50; LDL  78; and elevated Triglycerides 176* on 06/10/2014.   Also, the patient has history of Morbid obesity (BMI 41+) and consequent PreDiabetes since 04/2009 with A1c 6.5% and has had no symptoms of reactive hypoglycemia, diabetic polys, paresthesias or visual blurring.  Last A1c was 6.4% on  06/10/2014.   Further, the patient also has history of Vitamin D Deficiency (12 in 2008) and supplements vitamin D without any suspected side-effects. Last vitamin D was  92* on 06/10/2014.   Medication List   b complex vitamins tablet  Take 1 tablet by mouth daily.     bisoprolol-hydrochlorothiazide 5-6.25 MG per tablet  Commonly known as:  ZIAC  Take 1 tablet by mouth every morning.     cyclobenzaprine 10 MG tablet  Commonly known as:  FLEXERIL  Take 1 tablet (10 mg total) by mouth every 8 (eight) hours as needed for muscle spasms.     furosemide 40 MG tablet  Commonly known as:  LASIX  Take 40 mg by mouth every morning.     HYDROcodone-acetaminophen 5-325 MG per tablet  Commonly known as:  NORCO/VICODIN  Take 0.5-1 tablets by mouth every 3 (three) hours as needed for moderate pain.     levETIRAcetam 500 MG tablet  Commonly known as:  KEPPRA  Take 500  mg by mouth 3 (three) times daily.     levothyroxine 88 MCG tablet  Commonly known as:  SYNTHROID, LEVOTHROID  Take 88 mcg by mouth daily before breakfast.     Magnesium 400 MG Caps  Take 400 mg by mouth 3 (three) times daily.     ondansetron 8 MG disintegrating tablet  Commonly known as:  ZOFRAN-ODT  Take 1 tablet (8 mg total) by mouth every 8 (eight) hours as needed for nausea.     OVER THE COUNTER MEDICATION  Take 1 tablet by mouth every morning. Slow release iron     pantoprazole 40 MG tablet  Commonly known as:  PROTONIX  Take 40 mg by mouth every morning.     polyethylene glycol packet  Commonly known as:  MIRALAX / GLYCOLAX  Take 17 g by mouth daily.     potassium chloride SA 20 MEQ tablet  Commonly known as:  K-DUR,KLOR-CON  Take 20 mEq by mouth at bedtime.     rOPINIRole 2 MG tablet  Commonly known as:  REQUIP  Take 1 tablet (2 mg total) by mouth at bedtime.     rosuvastatin 40 MG tablet  Commonly known as:  CRESTOR  Take 40 mg by mouth every morning.     Vitamin D3 5000 UNITS Caps  Take 5,000 Units by mouth every other day.     Allergies  Allergen Reactions  .  Ace Inhibitors   . Atenolol   . Lyrica [Pregabalin] Other (See Comments)  . Trazodone And Nefazodone   . Adhesive [Tape] Rash   PMHx:   Past Medical History  Diagnosis Date  . Thyroid disease   . GERD (gastroesophageal reflux disease)   . Arthritis     knees  . Anemia   . Cancer     throid ca  . CHF (congestive heart failure)   . Shortness of breath   . Hypertension   . Hyperlipidemia   . Elevated hemoglobin A1c   . SAH (subarachnoid hemorrhage) 1973  . Vitamin D deficiency   . Hypothyroid   . RLS (restless legs syndrome)   . Stroke 6-7 yrs ago  . Sleep apnea     cannot tolerate cpap  . Seizures aug 2015    mild with arm jerking  . History of blood transfusion 2013   Immunization History  Administered Date(s) Administered  . DT 09/04/2014  . Influenza, High Dose Seasonal PF  07/23/2014  . Influenza-Unspecified 08/09/2013  . Pneumococcal Conjugate-13 06/10/2014  . Pneumococcal-Unspecified 10/25/2004  . Td 10/26/2003  . Zoster 10/26/2007   Past Surgical History  Procedure Laterality Date  . Abdominal hysterectomy    . Tonsillectomy    . Throidectomy      complete  . Knee surgery Bilateral     meniscus  . Esophagogastroduodenoscopy  07/03/2012    Procedure: ESOPHAGOGASTRODUODENOSCOPY (EGD);  Surgeon: Arta Silence, MD;  Location: [facility];  Service: Endoscopy;  Laterality: N/A;  . Appendectomy  1960  . Eye surgery Bilateral     lens replacments for cataracts  . Esophagogastroduodenoscopy (egd) with propofol N/A 07/30/2014    Procedure: ESOPHAGOGASTRODUODENOSCOPY (EGD) WITH PROPOFOL;  Surgeon: Jeryl Columbia, MD;  Location: [facility];  Service: Endoscopy;  Laterality: N/A;  . Hot hemostasis N/A 07/30/2014    Procedure: HOT HEMOSTASIS (ARGON PLASMA COAGULATION/BICAP);  Surgeon: Jeryl Columbia, MD;  Location: [facility];  Service: Endoscopy;  Laterality: N/A;   FHx:    Reviewed / unchanged  SHx:    Reviewed / unchanged  Systems Review:  Constitutional: Denies fever, chills, wt changes, headaches, insomnia, fatigue, night sweats, change in appetite. Eyes: Denies redness, blurred vision, diplopia, discharge, itchy, watery eyes.  ENT: Denies discharge, congestion, post nasal drip, epistaxis, sore throat, earache, hearing loss, dental pain, tinnitus, vertigo, sinus pain, snoring.  CV: Denies chest pain, palpitations, irregular heartbeat, syncope, dyspnea, diaphoresis, orthopnea, PND, claudication or edema. Respiratory: denies cough, dyspnea, DOE, pleurisy, hoarseness, laryngitis, wheezing.  Gastrointestinal: Denies dysphagia, odynophagia, heartburn, reflux, water brash, abdominal pain or cramps, nausea, vomiting, bloating, diarrhea, constipation, hematemesis, melena, hematochezia  or hemorrhoids. Genitourinary: Denies dysuria, frequency, urgency, nocturia,  hesitancy, discharge, hematuria or flank pain. Musculoskeletal: Denies arthralgias, myalgias, stiffness, jt. swelling, pain, limping or strain/sprain.  Skin: Denies pruritus, rash, hives, warts, acne, eczema or change in skin lesion(s). Neuro: No weakness, tremor, incoordination, spasms, paresthesia or pain. Psychiatric: Denies confusion, memory loss or sensory loss. Endo: Denies change in weight, skin or hair change.  Heme/Lymph: No excessive bleeding, bruising or enlarged lymph nodes.  Exam:   BP 124/82 mmHg  Pulse 64  Temp(Src) 96.8 F (36 C)  Resp 18  Ht 5\' 4"  (1.626 m)  Wt 241 lb (109.317 kg)  BMI 41.35 kg/m2  Appears Over nourished and in no distress. Eyes: PERRLA, EOMs, conjunctiva no swelling or erythema. Sinuses: No frontal/maxillary tenderness ENT/Mouth: EAC's clear, TM's nl w/o erythema, bulging. Nares clear w/o erythema, swelling,  exudates. Oropharynx clear without erythema or exudates. Oral hygiene is good. Tongue normal, non obstructing. Hearing intact.  Neck: Supple. Thyroid nl. Car 2+/2+ without bruits, nodes or JVD. Chest: Respirations nl with BS clear & equal w/o rales, rhonchi, wheezing or stridor.  Cor: Heart sounds distantw/ slightly irregular rate and rhythm without sig. murmurs, gallops, clicks, or rubs. Peripheral pulses normal and equal  With 1-2(+) pretibial edema. edema.  Abdomen: Soft & bowel sounds normal. Non-tender w/o guarding, rebound, hernias, masses, or organomegaly.  Lymphatics: Unremarkable.  Musculoskeletal: Full ROM all peripheral extremities, joint stability, 5/5 strength, and normal gait.  Skin: Warm, dry without exposed rashes, lesions or ecchymosis apparent.  Neuro: Cranial nerves intact, reflexes equal bilaterally. Sensory-motor testing grossly intact. Tendon reflexes grossly intact.  Pysch: Alert & oriented x 3.  Insight and judgement nl & appropriate. No ideations.  Assessment and Plan:  1. Hypertension - Continue monitor blood pressure  at home. Continue diet/meds same.  2. Hyperlipidemia - Continue diet/meds, exercise,& lifestyle modifications. Continue monitor periodic cholesterol/liver & renal functions   3. Pre-Diabetes - Continue diet, exercise, lifestyle modifications. Monitor appropriate labs.  4. Vitamin D Deficiency - Continue supplementation.  5. ASHD / Afib - on Xarelto   Recommended regular exercise, BP monitoring, weight control, and discussed med and SE's. Recommended labs to assess and monitor clinical status. Further disposition pending results of labs.

## 2014-09-05 ENCOUNTER — Other Ambulatory Visit: Payer: Self-pay | Admitting: *Deleted

## 2014-09-05 LAB — MAGNESIUM: Magnesium: 1.9 mg/dL (ref 1.5–2.5)

## 2014-09-05 LAB — CBC WITH DIFFERENTIAL/PLATELET
BASOS ABS: 0 10*3/uL (ref 0.0–0.1)
Basophils Relative: 0 % (ref 0–1)
Eosinophils Absolute: 0.3 10*3/uL (ref 0.0–0.7)
Eosinophils Relative: 3 % (ref 0–5)
HEMATOCRIT: 42.6 % (ref 36.0–46.0)
HEMOGLOBIN: 14.3 g/dL (ref 12.0–15.0)
LYMPHS ABS: 2.7 10*3/uL (ref 0.7–4.0)
LYMPHS PCT: 29 % (ref 12–46)
MCH: 30.5 pg (ref 26.0–34.0)
MCHC: 33.6 g/dL (ref 30.0–36.0)
MCV: 90.8 fL (ref 78.0–100.0)
MONOS PCT: 8 % (ref 3–12)
Monocytes Absolute: 0.7 10*3/uL (ref 0.1–1.0)
NEUTROS ABS: 5.5 10*3/uL (ref 1.7–7.7)
Neutrophils Relative %: 60 % (ref 43–77)
Platelets: 272 10*3/uL (ref 150–400)
RBC: 4.69 MIL/uL (ref 3.87–5.11)
RDW: 14.1 % (ref 11.5–15.5)
WBC: 9.2 10*3/uL (ref 4.0–10.5)

## 2014-09-05 LAB — BASIC METABOLIC PANEL WITH GFR
BUN: 16 mg/dL (ref 6–23)
CHLORIDE: 97 meq/L (ref 96–112)
CO2: 31 meq/L (ref 19–32)
Calcium: 9.4 mg/dL (ref 8.4–10.5)
Creat: 1.16 mg/dL — ABNORMAL HIGH (ref 0.50–1.10)
GFR, Est African American: 51 mL/min — ABNORMAL LOW
GFR, Est Non African American: 44 mL/min — ABNORMAL LOW
Glucose, Bld: 84 mg/dL (ref 70–99)
POTASSIUM: 3.6 meq/L (ref 3.5–5.3)
Sodium: 140 mEq/L (ref 135–145)

## 2014-09-05 LAB — HEPATIC FUNCTION PANEL
ALK PHOS: 63 U/L (ref 39–117)
ALT: 13 U/L (ref 0–35)
AST: 21 U/L (ref 0–37)
Albumin: 4 g/dL (ref 3.5–5.2)
Bilirubin, Direct: 0.1 mg/dL (ref 0.0–0.3)
Indirect Bilirubin: 0.4 mg/dL (ref 0.2–1.2)
TOTAL PROTEIN: 6.5 g/dL (ref 6.0–8.3)
Total Bilirubin: 0.5 mg/dL (ref 0.2–1.2)

## 2014-09-05 LAB — LIPID PANEL
Cholesterol: 167 mg/dL (ref 0–200)
HDL: 56 mg/dL (ref >39)
LDL CALC: 85 mg/dL (ref 0–99)
TRIGLYCERIDES: 132 mg/dL (ref <150)
Total CHOL/HDL Ratio: 3 Ratio
VLDL: 26 mg/dL (ref 0–40)

## 2014-09-05 LAB — HEMOGLOBIN A1C
Hgb A1c MFr Bld: 6.2 % — ABNORMAL HIGH (ref <5.7)
Mean Plasma Glucose: 131 mg/dL — ABNORMAL HIGH (ref <117)

## 2014-09-05 LAB — VITAMIN D 25 HYDROXY (VIT D DEFICIENCY, FRACTURES): VIT D 25 HYDROXY: 103 ng/mL — AB (ref 30–89)

## 2014-09-05 LAB — INSULIN, FASTING: Insulin fasting, serum: 4.8 u[IU]/mL (ref 2.0–19.6)

## 2014-09-05 LAB — TSH: TSH: 3.408 u[IU]/mL (ref 0.350–4.500)

## 2014-09-05 MED ORDER — POTASSIUM CHLORIDE CRYS ER 20 MEQ PO TBCR: 20.0000 meq | EXTENDED_RELEASE_TABLET | Freq: Three times a day (TID) | ORAL | Status: DC

## 2014-09-15 ENCOUNTER — Encounter: Payer: Self-pay | Admitting: *Deleted

## 2014-11-11 ENCOUNTER — Other Ambulatory Visit: Payer: Self-pay | Admitting: Internal Medicine

## 2014-11-11 DIAGNOSIS — E876 Hypokalemia: Secondary | ICD-10-CM

## 2014-11-11 MED ORDER — POTASSIUM CHLORIDE CRYS ER 20 MEQ PO TBCR: 20.0000 meq | EXTENDED_RELEASE_TABLET | Freq: Three times a day (TID) | ORAL | Status: DC

## 2014-12-05 ENCOUNTER — Encounter: Payer: Self-pay | Admitting: Physician Assistant

## 2014-12-05 ENCOUNTER — Ambulatory Visit: Payer: Self-pay | Admitting: Physician Assistant

## 2014-12-05 ENCOUNTER — Ambulatory Visit (INDEPENDENT_AMBULATORY_CARE_PROVIDER_SITE_OTHER): Payer: Medicare Other | Admitting: Physician Assistant

## 2014-12-05 VITALS — BP 122/72 | HR 80 | Temp 97.7°F | Resp 16 | Ht 64.0 in | Wt 242.0 lb

## 2014-12-05 DIAGNOSIS — H9193 Unspecified hearing loss, bilateral: Secondary | ICD-10-CM

## 2014-12-05 DIAGNOSIS — Z79899 Other long term (current) drug therapy: Secondary | ICD-10-CM

## 2014-12-05 DIAGNOSIS — I482 Chronic atrial fibrillation, unspecified: Secondary | ICD-10-CM

## 2014-12-05 DIAGNOSIS — R6889 Other general symptoms and signs: Secondary | ICD-10-CM

## 2014-12-05 DIAGNOSIS — Z7901 Long term (current) use of anticoagulants: Secondary | ICD-10-CM

## 2014-12-05 DIAGNOSIS — Z85828 Personal history of other malignant neoplasm of skin: Secondary | ICD-10-CM

## 2014-12-05 DIAGNOSIS — I10 Essential (primary) hypertension: Secondary | ICD-10-CM

## 2014-12-05 DIAGNOSIS — I609 Nontraumatic subarachnoid hemorrhage, unspecified: Secondary | ICD-10-CM

## 2014-12-05 DIAGNOSIS — G2581 Restless legs syndrome: Secondary | ICD-10-CM

## 2014-12-05 DIAGNOSIS — R7309 Other abnormal glucose: Secondary | ICD-10-CM

## 2014-12-05 DIAGNOSIS — E559 Vitamin D deficiency, unspecified: Secondary | ICD-10-CM

## 2014-12-05 DIAGNOSIS — Z9181 History of falling: Secondary | ICD-10-CM

## 2014-12-05 DIAGNOSIS — I5032 Chronic diastolic (congestive) heart failure: Secondary | ICD-10-CM

## 2014-12-05 DIAGNOSIS — E039 Hypothyroidism, unspecified: Secondary | ICD-10-CM

## 2014-12-05 DIAGNOSIS — Z1331 Encounter for screening for depression: Secondary | ICD-10-CM

## 2014-12-05 DIAGNOSIS — Z0001 Encounter for general adult medical examination with abnormal findings: Secondary | ICD-10-CM

## 2014-12-05 DIAGNOSIS — E785 Hyperlipidemia, unspecified: Secondary | ICD-10-CM

## 2014-12-05 DIAGNOSIS — R569 Unspecified convulsions: Secondary | ICD-10-CM

## 2014-12-05 LAB — CBC WITH DIFFERENTIAL/PLATELET
Basophils Absolute: 0 10*3/uL (ref 0.0–0.1)
Basophils Relative: 0 % (ref 0–1)
Eosinophils Absolute: 0.1 10*3/uL (ref 0.0–0.7)
Eosinophils Relative: 1 % (ref 0–5)
HCT: 43.7 % (ref 36.0–46.0)
Hemoglobin: 15 g/dL (ref 12.0–15.0)
LYMPHS ABS: 2.2 10*3/uL (ref 0.7–4.0)
LYMPHS PCT: 23 % (ref 12–46)
MCH: 30.3 pg (ref 26.0–34.0)
MCHC: 34.3 g/dL (ref 30.0–36.0)
MCV: 88.3 fL (ref 78.0–100.0)
MONO ABS: 0.7 10*3/uL (ref 0.1–1.0)
MPV: 9.8 fL (ref 8.6–12.4)
Monocytes Relative: 7 % (ref 3–12)
Neutro Abs: 6.5 10*3/uL (ref 1.7–7.7)
Neutrophils Relative %: 69 % (ref 43–77)
Platelets: 309 10*3/uL (ref 150–400)
RBC: 4.95 MIL/uL (ref 3.87–5.11)
RDW: 13.5 % (ref 11.5–15.5)
WBC: 9.4 10*3/uL (ref 4.0–10.5)

## 2014-12-05 LAB — BASIC METABOLIC PANEL WITH GFR
BUN: 17 mg/dL (ref 6–23)
CO2: 34 mEq/L — ABNORMAL HIGH (ref 19–32)
CREATININE: 1.14 mg/dL — AB (ref 0.50–1.10)
Calcium: 9.4 mg/dL (ref 8.4–10.5)
Chloride: 96 mEq/L (ref 96–112)
GFR, Est African American: 52 mL/min — ABNORMAL LOW
GFR, Est Non African American: 45 mL/min — ABNORMAL LOW
GLUCOSE: 98 mg/dL (ref 70–99)
Potassium: 3.7 mEq/L (ref 3.5–5.3)
Sodium: 141 mEq/L (ref 135–145)

## 2014-12-05 LAB — LIPID PANEL
Cholesterol: 177 mg/dL (ref 0–200)
HDL: 55 mg/dL (ref >39)
LDL CALC: 97 mg/dL (ref 0–99)
TRIGLYCERIDES: 125 mg/dL (ref <150)
Total CHOL/HDL Ratio: 3.2 Ratio
VLDL: 25 mg/dL (ref 0–40)

## 2014-12-05 LAB — HEPATIC FUNCTION PANEL
ALK PHOS: 73 U/L (ref 39–117)
ALT: 11 U/L (ref 0–35)
AST: 16 U/L (ref 0–37)
Albumin: 4.2 g/dL (ref 3.5–5.2)
Bilirubin, Direct: 0.1 mg/dL (ref 0.0–0.3)
Indirect Bilirubin: 0.3 mg/dL (ref 0.2–1.2)
TOTAL PROTEIN: 6.7 g/dL (ref 6.0–8.3)
Total Bilirubin: 0.4 mg/dL (ref 0.2–1.2)

## 2014-12-05 LAB — MAGNESIUM: Magnesium: 1.9 mg/dL (ref 1.5–2.5)

## 2014-12-05 MED ORDER — APIXABAN 5 MG PO TABS: 5.0000 mg | ORAL_TABLET | Freq: Two times a day (BID) | ORAL | Status: DC

## 2014-12-05 NOTE — Progress Notes (Signed)
MEDICARE ANNUAL WELLNESS VISIT AND FOLLOW UP  Assessment:   1. History of skin cancer New area on arm, will schedule removal.   2. Chronic atrial fibrillation Continue cardio follow up Continue eliquis  3. Diastolic CHF, chronic Weight stable, no SOB, continue lasix  4. Essential hypertension - continue medications, DASH diet, exercise and monitor at home. Call if greater than 130/80. - CBC with Differential/Platelet - BASIC METABOLIC PANEL WITH GFR - Hepatic function panel  5. Hypothyroidism, unspecified hypothyroidism type Hypothyroidism-check TSH level, continue medications the same, reminded to take on an empty stomach 30-44mins before food.  - TSH  6. SAH (subarachnoid hemorrhage) Continue Eliquis  7. Hyperlipidemia -continue medications, check lipids, decrease fatty foods, increase activity.  - Lipid panel  8. PreDiabetes Discussed general issues about diabetes pathophysiology and management., Educational material distributed., Suggested low cholesterol diet., Encouraged aerobic exercise., Discussed foot care., Reminded to get yearly retinal exam. - Hemoglobin A1c - [facility] DIABETES FOOT EXAM  9. RLS (restless legs syndrome) Continue requip  10. Seizures Continue Keppra  11. Chronic anticoagulation Follows with Dr. Tamala Julian  12. Vitamin D deficiency - Vit D  25 hydroxy (rtn osteoporosis monitoring)  13. Medication management - Magnesium  14. Decreased hearing, bilateral Some tinnitus, some decreased hearing, declines hearing aids  15. Morbid obesity Obesity with co morbidities- long discussion about weight loss, diet, and exercise  16. At moderate risk for fall Uses walker, declines Pt   Plan:   During the course of the visit the patient was educated and counseled about appropriate screening and preventive services including:    Pneumococcal vaccine   Influenza vaccine  Td vaccine  Screening electrocardiogram  Screening mammography  Bone  densitometry screening  Colorectal cancer screening  Diabetes screening  Glaucoma screening  Nutrition counseling   Advanced directives: given info/requested  Screening recommendations, referrals:  Vaccinations: Please see documentation below and orders this visit.   Nutrition assessed and recommended  Colonoscopy see Dr. Earlean Shawl Mammogram up to date Pap smear not indicated Pelvic exam not indicated Recommended yearly ophthalmology/optometry visit for glaucoma screening and checkup Recommended yearly dental visit for hygiene and checkup Advanced directives - requested  Conditions/risks identified: BMI: Discussed weight loss, diet, and increase physical activity.  Increase physical activity: AHA recommends 150 minutes of physical activity a week.  Medications reviewed DEXA- declined Diabetes is at goal, ACE/ARB therapy: Yes. Urinary Incontinence is an issue: discussed non pharmacology and pharmacology options.  Fall risk: moderate- discussed PT, home fall assessment, medications.    Subjective:   [name] is a 81 y.o. female who presents for Medicare Annual Wellness Visit and 3 month follow up on hypertension, prediabetes, hyperlipidemia, vitamin D def.  Date of last medicare wellness visit on 06/10/2014  Her blood pressure has been controlled at home, today their BP is BP: 122/72 mmHg She does not workout. She denies chest pain, shortness of breath. She does have some dizziness occ, but has hx of vertigo, worse in the car, meclizine helps.  She has Afib and has had [facility] 2009, she follows with Dr. Tamala Julian, was on coumadin but now on eliquis. She is on keppra for history of seizures, no recent seizures.  She is has RLS and is on requip for this at night.  She is on cholesterol medication and denies myalgias. Her cholesterol is not at goal. The cholesterol last visit was:   Lab Results  Component Value Date   CHOL 167 09/04/2014   HDL 56 09/04/2014  [place] 85  09/04/2014   TRIG 132 09/04/2014   CHOLHDL 3.0 09/04/2014   She has been working on diet and exercise for prediabetes, and denies polydipsia and polyuria. Last A1C in the office was:  Lab Results  Component Value Date   HGBA1C 6.2* 09/04/2014  Patient is on Vitamin D supplement. Lab Results  Component Value Date   VD25OH 103* 09/04/2014   BMI is Body mass index is 41.52 kg/(m^2)., she is working on diet and exercise, she has OSA and is on CPAP. Wt Readings from Last 3 Encounters:  12/05/14 242 lb (109.77 kg)  09/04/14 241 lb (109.317 kg)  08/05/14 239 lb 12.8 oz (108.773 kg)   She is on thyroid medication. Her medication was not changed last visit.   Lab Results  Component Value Date   TSH 3.408 09/04/2014  She had a skin cancer removed from left thigh last year, complains of erythematous scaly nodules on left forearm for 2-3 days, 1.5x1.5cm.     Names of Other Physician/Practitioners you currently use: 1. [facility]- here for primary care 2. Dr. Delman Cheadle, eye doctor, last visit 04/2014 Patient Care Team: Unk Pinto, MD as PCP - General (Internal Medicine) Jeryl Columbia, MD as Consulting Physician (Gastroenterology) Johna Sheriff, MD as Consulting Physician (Ophthalmology) Sinclair Grooms, MD as Consulting Physician (Cardiology) Arta Silence, MD as Consulting Physician (Gastroenterology)  Medication Review Current Outpatient Prescriptions on File Prior to Visit  Medication Sig Dispense Refill  . apixaban (ELIQUIS) 5 MG TABS tablet Take 1 tablet (5 mg total) by mouth 2 (two) times daily. 180 tablet 4  . b complex vitamins tablet Take 1 tablet by mouth daily.    . bisoprolol-hydrochlorothiazide (ZIAC) 5-6.25 MG per tablet Take 1 tablet by mouth every morning. (Patient taking differently: Take 1 tablet by mouth daily. ) 90 tablet 3  . Cholecalciferol (VITAMIN D3) 5000 UNITS CAPS Take 5,000 Units by mouth every other day. (Patient  taking differently: Take 5,000 Units by mouth 2 (two) times a week. Takes on Monday and Friday per patient)    . cyclobenzaprine (FLEXERIL) 10 MG tablet Take 1 tablet (10 mg total) by mouth every 8 (eight) hours as needed for muscle spasms. 90 tablet 1  . furosemide (LASIX) 40 MG tablet Take 40 mg by mouth every morning.    Marland Kitchen HYDROcodone-acetaminophen (NORCO/VICODIN) 5-325 MG per tablet Take 0.5-1 tablets by mouth every 3 (three) hours as needed for moderate pain.    Marland Kitchen levETIRAcetam (KEPPRA) 500 MG tablet Take 500 mg by mouth 3 (three) times daily.    Marland Kitchen levothyroxine (SYNTHROID, LEVOTHROID) 88 MCG tablet Take 88 mcg by mouth daily before breakfast.    . Magnesium 400 MG CAPS Take 400 mg by mouth 3 (three) times daily.     . ondansetron (ZOFRAN-ODT) 8 MG disintegrating tablet Take 1 tablet (8 mg total) by mouth every 8 (eight) hours as needed for nausea. 20 tablet 0  . OVER THE COUNTER MEDICATION Take 1 tablet by mouth every morning. Slow release iron    . pantoprazole (PROTONIX) 40 MG tablet Take 40 mg by mouth every morning.    . polyethylene glycol (MIRALAX / GLYCOLAX) packet Take 17 g by mouth daily.    . potassium chloride SA (K-DUR,KLOR-CON) 20 MEQ tablet Take 1 tablet (20 mEq total) by mouth 3 (three) times daily. 270 tablet 0  . rOPINIRole (REQUIP) 2 MG tablet Take 1 tablet (2 mg total) by mouth at  bedtime. 90 tablet 3  . rosuvastatin (CRESTOR) 40 MG tablet Take 40 mg by mouth every morning.     No current facility-administered medications on file prior to visit.    Current Problems (verified) Patient Active Problem List   Diagnosis Date Noted  . Vitamin D deficiency 02/28/2014  . Medication management 02/28/2014  . Chronic anticoagulation 02/01/2014  . Hypertension   . Hyperlipidemia   . PreDiabetes   . SAH (subarachnoid hemorrhage)   . RLS (restless legs syndrome)   . Seizures   . Warfarin-induced coagulopathy 07/03/2012  . A-fib 07/03/2012  . Diastolic CHF, chronic  02/40/9735  . Hypothyroidism 07/03/2012    Screening Tests Health Maintenance  Topic Date Due  . OPHTHALMOLOGY EXAM  08/24/1941  . DEXA SCAN  08/23/1998  . TETANUS/TDAP  10/25/2013  . URINE MICROALBUMIN  03/01/2015  . HEMOGLOBIN A1C  03/05/2015  . INFLUENZA VACCINE  05/26/2015  . FOOT EXAM  06/11/2015  . COLONOSCOPY  05/25/2021  . PNEUMOCOCCAL POLYSACCHARIDE VACCINE AGE 31 AND OVER  Completed  . ZOSTAVAX  Completed     Immunization History  Administered Date(s) Administered  . DT 09/04/2014  . Influenza, High Dose Seasonal PF 07/23/2014  . Influenza-Unspecified 08/09/2013  . Pneumococcal Conjugate-13 06/10/2014  . Pneumococcal-Unspecified 10/25/2004  . Td 10/26/2003  . Zoster 10/26/2007    Preventative care: Last colonoscopy: 2010 will not have another EGD: 2015 with Dr. Watt Climes Last mammogram: 04/2014 due 04/2015 Last pap smear/pelvic exam: remote  DEXA:2014 neg CXR 12/2012  [place] AB 2014 Stress test 2009 Echo 07/2013  Prior vaccinations: TD or Tdap: 2015 Influenza: 2015 Pneumococcal: 2006 Prevnar 13: 2015 Shingles/Zostavax: 2009  History reviewed: allergies, current medications, past family history, past medical history, past social history, past surgical history and problem list  Past Surgical History  Procedure Laterality Date  . Abdominal hysterectomy    . Tonsillectomy    . Throidectomy      complete  . Knee surgery Bilateral     meniscus  . Esophagogastroduodenoscopy  07/03/2012    Procedure: ESOPHAGOGASTRODUODENOSCOPY (EGD);  Surgeon: Arta Silence, MD;  Location: [facility];  Service: Endoscopy;  Laterality: N/A;  . Appendectomy  1960  . Eye surgery Bilateral     lens replacments for cataracts  . Esophagogastroduodenoscopy (egd) with propofol N/A 07/30/2014    Procedure: ESOPHAGOGASTRODUODENOSCOPY (EGD) WITH PROPOFOL;  Surgeon: Jeryl Columbia, MD;  Location: [facility];  Service: Endoscopy;  Laterality: N/A;  . Hot hemostasis N/A 07/30/2014     Procedure: HOT HEMOSTASIS (ARGON PLASMA COAGULATION/BICAP);  Surgeon: Jeryl Columbia, MD;  Location: [facility];  Service: Endoscopy;  Laterality: N/A;   Family History  Problem Relation Age of Onset  . Heart attack Mother   . Diabetes Mother   . Heart disease Mother   . Heart attack Father   . Hypertension Father   . Heart disease Father   . Heart disease Sister   . CVA Sister   . Heart disease Brother   . Heart disease Brother   . Cancer Brother     colon  . Hyperlipidemia Brother   . Heart disease Sister     Risk Factors: Osteoporosis/FallRisk: postmenopausal estrogen deficiency and dietary calcium and/or vitamin D deficiency In the past year have you fallen or had a near fall?:No History of fracture in the past year: no  Tobacco History  Substance Use Topics  . Smoking status: Former Smoker -- 1.00 packs/day for 20 years    Types: Cigarettes  Quit date: 11/03/1984  . Smokeless tobacco: Never Used  . Alcohol Use: No   She does not smoke.  Patient is a former smoker. Are there smokers in your home (other than you)?  No  Alcohol Current alcohol use: none  Caffeine Current caffeine use: coffee 1 /day  Exercise Current exercise: bicycling and no regular exercise  Nutrition/Diet Current diet: in general, an "unhealthy" diet  Cardiac risk factors: advanced age (older than 37 for men, 52 for women), diabetes mellitus, dyslipidemia, hypertension, obesity (BMI >= 30 kg/m2) and sedentary lifestyle.  Depression Screen (Note: if answer to either of the following is "Yes", a more complete depression screening is indicated)   Q1: Over the past two weeks, have you felt down, depressed or hopeless? No  Q2: Over the past two weeks, have you felt little interest or pleasure in doing things? No  Have you lost interest or pleasure in daily life? No  Do you often feel hopeless? No  Do you cry easily over simple problems? No  Activities of Daily Living In your present  state of health, do you have any difficulty performing the following activities?:  Driving? Yes Managing money?  No Feeding yourself? No Getting from bed to chair? No Climbing a flight of stairs? Yes Preparing food and eating?: No Bathing or showering? No Getting dressed: No Getting to the toilet? No Using the toilet:No Moving around from place to place: No   Are you sexually active?  Yes  Do you have more than one partner?  No  Vision Difficulties: No  Hearing Difficulties: Yes Do you often ask people to speak up or repeat themselves? Yes Do you experience ringing or noises in your ears? No Do you have difficulty understanding soft or whispered voices? Yes  Cognition  Do you feel that you have a problem with memory?Yes  Do you often misplace items? No  Do you feel safe at home?  Yes  Advanced directives Does patient have a [place]? Yes Does patient have a Living Will? Yes   Objective:   Blood pressure 122/72, pulse 80, temperature 97.7 F (36.5 C), resp. rate 16, height 5\' 4"  (1.626 m), weight 242 lb (109.77 kg). Body mass index is 41.52 kg/(m^2).  General appearance: alert, no distress, WD/WN,  female Cognitive Testing  Alert? Yes  Normal Appearance?Yes  Oriented to person? Yes  Place? Yes   Time? Yes  Recall of three objects?  Yes  Can perform simple calculations? Yes  Displays appropriate judgment?Yes  Can read the correct time from a watch face?Yes  HEENT: normocephalic, sclerae anicteric, TMs pearly, nares patent, no discharge or erythema, pharynx normal Oral cavity: MMM, no lesions Neck: supple, no lymphadenopathy, no thyromegaly, no masses Heart: RRR, normal S1, S2, no murmurs Lungs: CTA bilaterally, no wheezes, rhonchi, or rales Abdomen: +bs, soft, obese, non tender, non distended, no masses, no hepatomegaly, no splenomegaly Musculoskeletal: nontender, no swelling, no obvious deformity Extremities: no edema, no cyanosis, no  clubbing Pulses: 2+ symmetric, upper and lower extremities, normal cap refill Neurological: alert, oriented x 3, CN2-12 intact, strength normal upper extremities and lower extremities, sensation normal throughout, DTRs 2+ throughout, no cerebellar signs, gait unsteady walks with walker Skin: erythematous scaly nodules on left forearm for 2-3 days, 1.5x1.5cm.  Psychiatric: normal affect, behavior normal, pleasant  Breast: defer Gyn: defer Rectal: defer  Medicare Attestation I have personally reviewed: The patient's medical and social history Their use of alcohol, tobacco or illicit drugs Their  current medications and supplements The patient's functional ability including ADLs,fall risks, home safety risks, cognitive, and hearing and visual impairment Diet and physical activities Evidence for depression or mood disorders  The patient's weight, height, BMI, and visual acuity have been recorded in the chart.  I have made referrals, counseling, and provided education to the patient based on review of the above and I have provided the patient with a written personalized care plan for preventive services.     Vicie Mutters, PA-C   12/05/2014

## 2014-12-05 NOTE — Patient Instructions (Signed)
Diabetes is a very complicated disease...lets simplify it.  An easy way to look at it to understand the complications is if you think of the extra sugar floating in your blood stream as glass shards floating through your blood stream.    Diabetes affects your small vessels first: 1) The glass shards (sugar) scraps down the tiny blood vessels in your eyes and lead to diabetic retinopathy, the leading cause of blindness in the [place]. Diabetes is the leading cause of newly diagnosed adult (20 to 79 years of age) blindness in the [place].  2) The glass shards scratches down the tiny vessels of your legs leading to nerve damage called neuropathy and can lead to amputations of your feet. More than 60% of all non-traumatic amputations of lower limbs occur in people with diabetes.  3) Over time the small vessels in your brain are shredded and closed off, individually this does not cause any problems but over a long period of time many of the small vessels being blocked can lead to Vascular Dementia.   4) Your kidney's are a filter system and have a "net" that keeps certain things in the body and lets bad things out. Sugar shreds this net and leads to kidney damage and eventually failure. Decreasing the sugar that is destroying the net and certain blood pressure medications can help stop or decrease progression of kidney disease. Diabetes was the primary cause of kidney failure in 44 percent of all new cases in 2011.  5) Diabetes also destroys the small vessels in your penis that lead to erectile dysfunction. Eventually the vessels are so damaged that you may not be responsive to cialis or viagra.   Diabetes and your large vessels: Your larger vessels consist of your coronary arteries in your heart and the carotid vessels to your brain. Diabetes or even increased sugars put you at 300% increased risk of heart attack and stroke and this is why.. The sugar scrapes down your large blood vessels and your body  sees this as an internal injury and tries to repair itself. Just like you get a scab on your skin, your platelets will stick to the blood vessel wall trying to heal it. This is why we have diabetics on low dose aspirin daily, this prevents the platelets from sticking and can prevent plaque formation. In addition, your body takes cholesterol and tries to shove it into the open wound. This is why we want your LDL, or bad cholesterol, below 70.   The combination of platelets and cholesterol over 5-10 years forms plaque that can break off and cause a heart attack or stroke.   PLEASE REMEMBER:  Diabetes is preventable! Up to 85 percent of complications and morbidities among individuals with type 2 diabetes can be prevented, delayed, or effectively treated and minimized with regular visits to a health professional, appropriate monitoring and medication, and a healthy diet and lifestyle.  Before you even begin to attack a weight-loss plan, it pays to remember this: You are not fat. You have fat. Losing weight isn't about blame or shame; it's simply another achievement to accomplish. Dieting is like any other skill-you have to buckle down and work at it. As long as you act in a smart, reasonable way, you'll ultimately get where you want to be. Here are some weight loss pearls for you.  1. It's Not a Diet. It's a Lifestyle Thinking of a diet as something you're on and suffering through only for the short term doesn't   work. To shed weight and keep it off, you need to make permanent changes to the way you eat. It's OK to indulge occasionally, of course, but if you cut calories temporarily and then revert to your old way of eating, you'll gain back the weight quicker than you can say yo-yo. Use it to lose it. Research shows that one of the best predictors of long-term weight loss is how many pounds you drop in the first month. For that reason, nutritionists often suggest being stricter for the first two weeks of your  new eating strategy to build momentum. Cut out added sugar and alcohol and avoid unrefined carbs. After that, figure out how you can reincorporate them in a way that's healthy and maintainable.  2. There's a Right Way to Exercise Working out burns calories and fat and boosts your metabolism by building muscle. But those trying to lose weight are notorious for overestimating the number of calories they burn and underestimating the amount they take in. Unfortunately, your system is biologically programmed to hold on to extra pounds and that means when you start exercising, your body senses the deficit and ramps up its hunger signals. If you're not diligent, you'll eat everything you burn and then some. Use it to lose it. Cardio gets all the exercise glory, but strength and interval training are the real heroes. They help you build lean muscle, which in turn increases your metabolism and calorie-burning ability 3. Don't Overreact to Mild Hunger Some people have a hard time losing weight because of hunger anxiety. To them, being hungry is bad-something to be avoided at all costs-so they carry snacks with them and eat when they don't need to. Others eat because they're stressed out or bored. While you never want to get to the point of being ravenous (that's when bingeing is likely to happen), a hunger pang, a craving, or the fact that it's 3:00 p.m. should not send you racing for the vending machine or obsessing about the energy bar in your purse. Ideally, you should put off eating until your stomach is growling and it's difficult to concentrate.  Use it to lose it. When you feel the urge to eat, use the HALT method. Ask yourself, Am I really hungry? Or am I angry or anxious, lonely or bored, or tired? If you're still not certain, try the apple test. If you're truly hungry, an apple should seem delicious; if it doesn't, something else is going on. Or you can try drinking water and making yourself busy, if you are  still hungry try a healthy snack.  4. Not All Calories Are Created Equal The mechanics of weight loss are pretty simple: Take in fewer calories than you use for energy. But the kind of food you eat makes all the difference. Processed food that's high in saturated fat and refined starch or sugar can cause inflammation that disrupts the hormone signals that tell your brain you're full. The result: You eat a lot more.  Use it to lose it. Clean up your diet. Swap in whole, unprocessed foods, including vegetables, lean protein, and healthy fats that will fill you up and give you the biggest nutritional bang for your calorie buck. In a few weeks, as your brain starts receiving regular hunger and fullness signals once again, you'll notice that you feel less hungry overall and naturally start cutting back on the amount you eat.  5. Protein, Produce, and Plant-Based Fats Are Your Weight-Loss Trinity Here's why eating the three   Ps regularly will help you drop pounds. Protein fills you up. You need it to build lean muscle, which keeps your metabolism humming so that you can torch more fat. People in a weight-loss program who ate double the recommended daily allowance for protein (about 110 grams for a 150-pound woman) lost 70 percent of their weight from fat, while people who ate the RDA lost only about 40 percent, one study found. Produce is packed with filling fiber. "It's very difficult to consume too many calories if you're eating a lot of vegetables. Example: Three cups of broccoli is a lot of food, yet only 93 calories. (Fruit is another story. It can be easy to overeat and can contain a lot of calories from sugar, so be sure to monitor your intake.) Plant-based fats like olive oil and those in avocados and nuts are healthy and extra satiating.  Use it to lose it. Aim to incorporate each of the three Ps into every meal and snack. People who eat protein throughout the day are able to keep weight off, according  to a study in the American Journal of Clinical Nutrition. In addition to meat, poultry and seafood, good sources are beans, lentils, eggs, tofu, and yogurt. As for fat, keep portion sizes in check by measuring out salad dressing, oil, and nut butters (shoot for one to two tablespoons). Finally, eat veggies or a little fruit at every meal. People who did that consumed 308 fewer calories but didn't feel any hungrier than when they didn't eat more produce.  7. How You Eat Is As Important As What You Eat In order for your brain to register that you're full, you need to focus on what you're eating. Sit down whenever you eat, preferably at a table. Turn off the TV or computer, put down your phone, and look at your food. Smell it. Chew slowly, and don't put another bite on your fork until you swallow. When women ate lunch this attentively, they consumed 30 percent less when snacking later than those who listened to an audiobook at lunchtime, according to a study in the [employer] Journal of Nutrition. 8. Weighing Yourself Really Works The scale provides the best evidence about whether your efforts are paying off. Seeing the numbers tick up or down or stagnate is motivation to keep going-or to rethink your approach. A 2015 study at [employer] found that daily weigh-ins helped people lose more weight, keep it off, and maintain that loss, even after two years. Use it to lose it. Step on the scale at the same time every day for the best results. If your weight shoots up several pounds from one weigh-in to the next, don't freak out. Eating a lot of salt the night before or having your period is the likely culprit. The number should return to normal in a day or two. It's a steady climb that you need to do something about. 9. Too Much Stress and Too Little Sleep Are Your Enemies When you're tired and frazzled, your body cranks up the production of cortisol, the stress hormone that can cause carb cravings. Not getting  enough sleep also boosts your levels of ghrelin, a hormone associated with hunger, while suppressing leptin, a hormone that signals fullness and satiety. People on a diet who slept only five and a half hours a night for two weeks lost 55 percent less fat and were hungrier than those who slept eight and a half hours, according to a study in the [employer]   [employer] Journal. Use it to lose it. Prioritize sleep, aiming for seven hours or more a night, which research shows helps lower stress. And make sure you're getting quality zzz's. If a snoring spouse or a fidgety cat wakes you up frequently throughout the night, you may end up getting the equivalent of just four hours of sleep, according to a study from [facility]. Keep pets out of the bedroom, and use a white-noise app to drown out snoring. 10. You Will Hit a plateau-And You Can Bust Through It As you slim down, your body releases much less leptin, the fullness hormone.  If you're not strength training, start right now. Building muscle can raise your metabolism to help you overcome a plateau. To keep your body challenged and burning calories, incorporate new moves and more intense intervals into your workouts or add another sweat session to your weekly routine. Alternatively, cut an extra 100 calories or so a day from your diet. Now that you've lost weight, your body simply doesn't need as much fuel.

## 2014-12-06 LAB — HEMOGLOBIN A1C
Hgb A1c MFr Bld: 6.2 % — ABNORMAL HIGH (ref <5.7)
Mean Plasma Glucose: 131 mg/dL — ABNORMAL HIGH (ref <117)

## 2014-12-06 LAB — TSH: TSH: 5.121 u[IU]/mL — ABNORMAL HIGH (ref 0.350–4.500)

## 2014-12-06 LAB — VITAMIN D 25 HYDROXY (VIT D DEFICIENCY, FRACTURES): Vit D, 25-Hydroxy: 73 ng/mL (ref 30–100)

## 2015-01-02 ENCOUNTER — Other Ambulatory Visit (INDEPENDENT_AMBULATORY_CARE_PROVIDER_SITE_OTHER): Payer: Medicare Other

## 2015-01-02 DIAGNOSIS — E039 Hypothyroidism, unspecified: Secondary | ICD-10-CM

## 2015-01-02 LAB — TSH: TSH: 2.394 u[IU]/mL (ref 0.350–4.500)

## 2015-01-07 ENCOUNTER — Ambulatory Visit (INDEPENDENT_AMBULATORY_CARE_PROVIDER_SITE_OTHER): Payer: Medicare Other | Admitting: Internal Medicine

## 2015-01-07 ENCOUNTER — Encounter: Payer: Self-pay | Admitting: Internal Medicine

## 2015-01-07 VITALS — BP 118/76 | HR 84 | Temp 98.4°F | Resp 18 | Ht 64.0 in | Wt 243.0 lb

## 2015-01-07 DIAGNOSIS — I10 Essential (primary) hypertension: Secondary | ICD-10-CM

## 2015-01-07 DIAGNOSIS — Z85828 Personal history of other malignant neoplasm of skin: Secondary | ICD-10-CM

## 2015-01-07 DIAGNOSIS — D485 Neoplasm of uncertain behavior of skin: Secondary | ICD-10-CM

## 2015-01-08 ENCOUNTER — Encounter: Payer: Self-pay | Admitting: Internal Medicine

## 2015-01-08 NOTE — Progress Notes (Signed)
   Subjective:    Patient ID: [name], female    DOB: Feb 07, 1931, 80 y.o.   MRN: [id number]  HPI  Very nice 81 yo MWF with HTN, hx/o SAH (1980) and later CVA(2009) and recovered from both presents w/o c/o HA, dizziness, CP, palpitations, Dyspnea and who's doing well on current med regimen has c/o about 2 areas on her right cheek and dorsal mid rt forearm that are pruritic.    Meds/All/ PMHx & PSHx - all reviewed & unchanged  Review of Systems 10 point systems review negative except as above.    Objective:   Physical Exam BP 118/76  Pulse 84  Temp 98.4 F  Resp 18  Ht 5\' 4"    Wt 243 lb    BMI 41.69   HEENT - Eac's patent. TM's Nl. EOM's full. PERRLA. NasoOroPharynx clear. Neck - supple. Nl Thyroid. Carotids 2+ & No bruits, nodes, JVD Chest - Clear equal BS w/o Rales, rhonchi, wheezes. Cor - Nl HS. RRR w/o sig MGR. PP 1(+). No edema. MS- FROM w/o deformities. Muscle power, tone and bulk Nl. Gait Nl. Neuro - No obvious Cr N abnormalities. Sensory, motor and Cerebellar functions appear Nl w/o focal abnormalities. Psyche - Mental status normal & appropriate.  No delusions, ideations or obvious mood abnormalities.  Skin - shows 2 similar lesions , each measuring ~18 x 18 mm appearing excoriated, slight or light brown and crusted  and slightly raised appearing consistant with irritated seborrheic keratoses.   After informed consent each area was treated with LN2 at calc temp of - 340 deg, both lesions were treated by a triple freeze / thaw technique. Patient tolerated the procedure (s) well.      (Procedures 17000 & 97741)  Assessment & Plan:   1. Essential hypertension  2. Neoplasm of uncertain behavior of skin  3. History of skin cancer  - Post procedure wound care advised.

## 2015-02-09 ENCOUNTER — Other Ambulatory Visit: Payer: Self-pay | Admitting: Internal Medicine

## 2015-02-20 ENCOUNTER — Other Ambulatory Visit: Payer: Self-pay | Admitting: Internal Medicine

## 2015-02-21 ENCOUNTER — Other Ambulatory Visit: Payer: Self-pay | Admitting: Internal Medicine

## 2015-02-28 ENCOUNTER — Other Ambulatory Visit: Payer: Self-pay | Admitting: Internal Medicine

## 2015-03-05 ENCOUNTER — Ambulatory Visit (INDEPENDENT_AMBULATORY_CARE_PROVIDER_SITE_OTHER): Payer: Medicare Other | Admitting: Internal Medicine

## 2015-03-05 ENCOUNTER — Encounter: Payer: Self-pay | Admitting: Internal Medicine

## 2015-03-05 VITALS — BP 120/74 | HR 80 | Temp 97.3°F | Resp 16 | Ht 63.5 in | Wt 237.8 lb

## 2015-03-05 DIAGNOSIS — R7309 Other abnormal glucose: Secondary | ICD-10-CM

## 2015-03-05 DIAGNOSIS — Z1331 Encounter for screening for depression: Secondary | ICD-10-CM

## 2015-03-05 DIAGNOSIS — I10 Essential (primary) hypertension: Secondary | ICD-10-CM

## 2015-03-05 DIAGNOSIS — Z1212 Encounter for screening for malignant neoplasm of rectum: Secondary | ICD-10-CM

## 2015-03-05 DIAGNOSIS — E559 Vitamin D deficiency, unspecified: Secondary | ICD-10-CM

## 2015-03-05 DIAGNOSIS — R569 Unspecified convulsions: Secondary | ICD-10-CM

## 2015-03-05 DIAGNOSIS — Z9181 History of falling: Secondary | ICD-10-CM

## 2015-03-05 DIAGNOSIS — K219 Gastro-esophageal reflux disease without esophagitis: Secondary | ICD-10-CM

## 2015-03-05 DIAGNOSIS — E785 Hyperlipidemia, unspecified: Secondary | ICD-10-CM

## 2015-03-05 DIAGNOSIS — Z79899 Other long term (current) drug therapy: Secondary | ICD-10-CM

## 2015-03-05 DIAGNOSIS — I482 Chronic atrial fibrillation, unspecified: Secondary | ICD-10-CM

## 2015-03-05 DIAGNOSIS — E039 Hypothyroidism, unspecified: Secondary | ICD-10-CM

## 2015-03-05 LAB — HEPATIC FUNCTION PANEL
ALT: 15 U/L (ref 0–35)
AST: 19 U/L (ref 0–37)
Albumin: 4 g/dL (ref 3.5–5.2)
Alkaline Phosphatase: 65 U/L (ref 39–117)
BILIRUBIN DIRECT: 0.1 mg/dL (ref 0.0–0.3)
Indirect Bilirubin: 0.4 mg/dL (ref 0.2–1.2)
Total Bilirubin: 0.5 mg/dL (ref 0.2–1.2)
Total Protein: 6.8 g/dL (ref 6.0–8.3)

## 2015-03-05 LAB — BASIC METABOLIC PANEL WITH GFR
BUN: 14 mg/dL (ref 6–23)
CALCIUM: 9.5 mg/dL (ref 8.4–10.5)
CO2: 32 mEq/L (ref 19–32)
CREATININE: 1.05 mg/dL (ref 0.50–1.10)
Chloride: 98 mEq/L (ref 96–112)
GFR, EST AFRICAN AMERICAN: 58 mL/min — AB
GFR, EST NON AFRICAN AMERICAN: 50 mL/min — AB
GLUCOSE: 93 mg/dL (ref 70–99)
Potassium: 3.7 mEq/L (ref 3.5–5.3)
Sodium: 141 mEq/L (ref 135–145)

## 2015-03-05 LAB — HEMOGLOBIN A1C
HEMOGLOBIN A1C: 6.2 % — AB (ref <5.7)
MEAN PLASMA GLUCOSE: 131 mg/dL — AB (ref <117)

## 2015-03-05 LAB — LIPID PANEL
CHOL/HDL RATIO: 2.9 ratio
Cholesterol: 161 mg/dL (ref 0–200)
HDL: 56 mg/dL (ref >=46)
LDL Cholesterol: 79 mg/dL (ref 0–99)
Triglycerides: 132 mg/dL (ref <150)
VLDL: 26 mg/dL (ref 0–40)

## 2015-03-05 LAB — MAGNESIUM: Magnesium: 1.8 mg/dL (ref 1.5–2.5)

## 2015-03-05 MED ORDER — LEVOTHYROXINE SODIUM 100 MCG PO TABS: ORAL_TABLET | ORAL | Status: AC

## 2015-03-05 MED ORDER — FUROSEMIDE 40 MG PO TABS: ORAL_TABLET | ORAL | Status: DC

## 2015-03-05 MED ORDER — PANTOPRAZOLE SODIUM 40 MG PO TBEC: DELAYED_RELEASE_TABLET | ORAL | Status: DC

## 2015-03-05 MED ORDER — RANITIDINE HCL 300 MG PO TABS: ORAL_TABLET | ORAL | Status: DC

## 2015-03-05 NOTE — Patient Instructions (Addendum)
++++++++++++++++++++++++++++++++++  Recommend Low dose or baby Aspirin 81 mg daily   To reduce risk of Colon Cancer 20 %, Skin Cancer 26 % , Melanoma 46% and   Pancreatic cancer 60%  +++++++++++++++++++++++++++++++++ Vitamin D goal is between 70-100.   Please make sure that you are taking your Vitamin D as directed.   It is very important as a natural antiinflammatory   helping hair, skin, and nails, as well as reducing stroke and heart attack risk.   It helps your bones and helps with mood.  It also decreases numerous cancer risks so please take it as directed.   Low Vit D is associated with a 200-300% higher risk for CANCER   and 200-300% higher risk for HEART   ATTACK  &  STROKE.    ...................................................................................  It is also associated with higher death rate at younger ages,   autoimmune diseases like Rheumatoid arthritis, Lupus, Multiple Sclerosis.     Also many other serious conditions, like depression, Alzheimer's  Dementia, infertility, muscle aches, fatigue, fibromyalgia - just to name a few.  ++++++++++++++++++++++++++++++++++++++++ Recommend the book "The END of DIETING" by Dr Joel Fuhrman   & the book "The END of DIABETES " by Dr Joel Fuhrman  At [employer] - get book & Audio CD's     Being diabetic has a  300% increased risk for heart attack, stroke, cancer, and alzheimer- type vascular dementia. It is very important that you work harder with diet by avoiding all foods that are white. Avoid white rice (brown & wild rice is OK), white potatoes (sweetpotatoes in moderation is OK), White bread or wheat bread or anything made out of white flour like bagels, donuts, rolls, buns, biscuits, cakes, pastries, cookies, pizza crust, and pasta (made from white flour & egg whites) - vegetarian pasta or spinach or wheat pasta is OK. Multigrain breads like Arnold's or [employer], or multigrain sandwich thins or  flatbreads.  Diet, exercise and weight loss can reverse and cure diabetes in the early stages.  Diet, exercise and weight loss is very important in the control and prevention of complications of diabetes which affects every system in your body, ie. Brain - dementia/stroke, eyes - glaucoma/blindness, heart - heart attack/heart failure, kidneys - dialysis, stomach - gastric paralysis, intestines - malabsorption, nerves - severe painful neuritis, circulation - gangrene & loss of a leg(s), and finally cancer and Alzheimers.    I recommend avoid fried & greasy foods,  sweets/candy, white rice (brown or wild rice or Quinoa is OK), white potatoes (sweet potatoes are OK) - anything made from white flour - bagels, doughnuts, rolls, buns, biscuits,white and wheat breads, pizza crust and traditional pasta made of white flour & egg white(vegetarian pasta or spinach or wheat pasta is OK).  Multi-grain bread is OK - like multi-grain flat bread or sandwich thins. Avoid alcohol in excess. Exercise is also important.    Eat all the vegetables you want - avoid meat, especially red meat and dairy - especially cheese.  Cheese is the most concentrated form of trans-fats which is the worst thing to clog up our arteries. Veggie cheese is OK which can be found in the fresh produce section at [employer] or [employer] or [employer]  ++++++++++++++++++++++++++++++++++++++++++++++++++++++++ GETTING OFF OF PPI's    Nexium/protonix/prilosec/Omeprazole/Dexilant/Aciphex are called PPI's, they are great at healing your stomach but should only be taken for a short period of time.     Recent studies have shown that taken for a long time   they  can increase the risk of osteoporosis (weakening of your bones), pneumonia, low magnesium, restless legs, Cdiff (infection that causes diarrhea), DEMENTIA and most recently kidney damage / disease / insufficiency.     Due to this information we want to try to stop the PPI but if you try to stop  it abruptly this can cause rebound acid and worsening symptoms.   So this is how we want you to get off the PPI:  - Start taking the nexium/protonix/prilosec/PPI  every other day with  zantac (ranitidine) 2 x a day for 2-4 weeks  - then decrease the PPI to every 3 days while taking the zantac (ranitidine) twice a day the other  days for 2-4  Weeks  - then you can try the zantac (ranitidine) once at night or up to 2 x day as needed.  - you can continue on this once at night or stop all together  - Avoid alcohol, spicy foods, NSAIDS (aleve, ibuprofen) at this time. See foods below.   +++++++++++++++++++++++++++++++++++++++++++  Food Choices for Gastroesophageal Reflux Disease  When you have gastroesophageal reflux disease (GERD), the foods you eat and your eating habits are very important. Choosing the right foods can help ease the discomfort of GERD. WHAT GENERAL GUIDELINES DO I NEED TO FOLLOW?  Choose fruits, vegetables, whole grains, low-fat dairy products, and low-fat meat, fish, and poultry.  Limit fats such as oils, salad dressings, butter, nuts, and avocado.  Keep a food diary to identify foods that cause symptoms.  Avoid foods that cause reflux. These may be different for different people.  Eat frequent small meals instead of three large meals each day.  Eat your meals slowly, in a relaxed setting.  Limit fried foods.  Cook foods using methods other than frying.  Avoid drinking alcohol.  Avoid drinking large amounts of liquids with your meals.  Avoid bending over or lying down until 2-3 hours after eating.   WHAT FOODS ARE NOT RECOMMENDED? The following are some foods and drinks that may worsen your symptoms:  Vegetables Tomatoes. Tomato juice. Tomato and spaghetti sauce. Chili peppers. Onion and garlic. Horseradish. Fruits Oranges, grapefruit, and lemon (fruit and juice). Meats High-fat meats, fish, and poultry. This includes hot dogs, ribs, ham, sausage,  salami, and bacon. Dairy Whole milk and chocolate milk. Sour cream. Cream. Butter. Ice cream. Cream cheese.  Beverages Coffee and tea, with or without caffeine. Carbonated beverages or energy drinks. Condiments Hot sauce. Barbecue sauce.  Sweets/Desserts Chocolate and cocoa. Donuts. Peppermint and spearmint. Fats and Oils High-fat foods, including French fries and potato chips. Other Vinegar. Strong spices, such as black pepper, white pepper, red pepper, cayenne, curry powder, cloves, ginger, and chili powder. Nexium/protonix/prilosec are called PPI's, they are great at healing your stomach but should only be taken for a short period of time.   +++++++++++++++++++++++++++++++++++ Preventive Care for Adults A healthy lifestyle and preventive care can promote health and wellness. Preventive health guidelines for women include the following key practices.  A routine yearly physical is a good way to check with your health care provider about your health and preventive screening. It is a chance to share any concerns and updates on your health and to receive a thorough exam.  Visit your dentist for a routine exam and preventive care every 6 months. Brush your teeth twice a day and floss once a day. Good oral hygiene prevents tooth decay and gum disease.  The frequency of eye exams is based on your   age, health, family medical history, use of contact lenses, and other factors. Follow your health care provider's recommendations for frequency of eye exams.  Eat a healthy diet. Foods like vegetables, fruits, whole grains, low-fat dairy products, and lean protein foods contain the nutrients you need without too many calories. Decrease your intake of foods high in solid fats, added sugars, and salt. Eat the right amount of calories for you.Get information about a proper diet from your health care provider, if necessary.  Regular physical exercise is one of the most important things you can do for your  health. Most adults should get at least 150 minutes of moderate-intensity exercise (any activity that increases your heart rate and causes you to sweat) each week. In addition, most adults need muscle-strengthening exercises on 2 or more days a week.  Maintain a healthy weight. The body mass index (BMI) is a screening tool to identify possible weight problems. It provides an estimate of body fat based on height and weight. Your health care provider can find your BMI and can help you achieve or maintain a healthy weight.For adults 20 years and older:  A BMI below 18.5 is considered underweight.  A BMI of 18.5 to 24.9 is normal.  A BMI of 25 to 29.9 is considered overweight.  A BMI of 30 and above is considered obese.  Maintain normal blood lipids and cholesterol levels by exercising and minimizing your intake of saturated fat. Eat a balanced diet with plenty of fruit and vegetables. If your lipid or cholesterol levels are high, you are over 50, or you are at high risk for heart disease, you may need your cholesterol levels checked more frequently.Ongoing high lipid and cholesterol levels should be treated with medicines if diet and exercise are not working.  If you smoke, find out from your health care provider how to quit. If you do not use tobacco, do not start.  Lung cancer screening is recommended for adults aged 55-80 years who are at high risk for developing lung cancer because of a history of smoking. A yearly low-dose CT scan of the lungs is recommended for people who have at least a 30-pack-year history of smoking and are a current smoker or have quit within the past 15 years. A pack year of smoking is smoking an average of 1 pack of cigarettes a day for 1 year (for example: 1 pack a day for 30 years or 2 packs a day for 15 years). Yearly screening should continue until the smoker has stopped smoking for at least 15 years. Yearly screening should be stopped for people who develop a health  problem that would prevent them from having lung cancer treatment.  Avoid use of street drugs. Do not share needles with anyone. Ask for help if you need support or instructions about stopping the use of drugs.  High blood pressure causes heart disease and increases the risk of stroke.  Ongoing high blood pressure should be treated with medicines if weight loss and exercise do not work.  If you are 55-79 years old, ask your health care provider if you should take aspirin to prevent strokes.  Diabetes screening involves taking a blood sample to check your fasting blood sugar level. This should be done once every 3 years, after age 45, if you are within normal weight and without risk factors for diabetes. Testing should be considered at a younger age or be carried out more frequently if you are overweight and have at least 1   risk factor for diabetes.  Breast cancer screening is essential preventive care for women. You should practice "breast self-awareness." This means understanding the normal appearance and feel of your breasts and may include breast self-examination. Any changes detected, no matter how small, should be reported to a health care provider. Women in their 20s and 30s should have a clinical breast exam (CBE) by a health care provider as part of a regular health exam every 1 to 3 years. After age 40, women should have a CBE every year. Starting at age 40, women should consider having a mammogram (breast X-ray test) every year. Women who have a family history of breast cancer should talk to their health care provider about genetic screening. Women at a high risk of breast cancer should talk to their health care providers about having an MRI and a mammogram every year.  Breast cancer gene (BRCA)-related cancer risk assessment is recommended for women who have family members with BRCA-related cancers. BRCA-related cancers include breast, ovarian, tubal, and peritoneal cancers. Having family  members with these cancers may be associated with an increased risk for harmful changes (mutations) in the breast cancer genes BRCA1 and BRCA2. Results of the assessment will determine the need for genetic counseling and BRCA1 and BRCA2 testing.  Routine pelvic exams to screen for cancer are no longer recommended for nonpregnant women who are considered low risk for cancer of the pelvic organs (ovaries, uterus, and vagina) and who do not have symptoms. Ask your health care provider if a screening pelvic exam is right for you.  If you have had past treatment for cervical cancer or a condition that could lead to cancer, you need Pap tests and screening for cancer for at least 20 years after your treatment. If Pap tests have been discontinued, your risk factors (such as having a new sexual partner) need to be reassessed to determine if screening should be resumed. Some women have medical problems that increase the chance of getting cervical cancer. In these cases, your health care provider may recommend more frequent screening and Pap tests.    Colorectal cancer can be detected and often prevented. Most routine colorectal cancer screening begins at the age of 50 years and continues through age 75 years. However, your health care provider may recommend screening at an earlier age if you have risk factors for colon cancer. On a yearly basis, your health care provider may provide home test kits to check for hidden blood in the stool. Use of a small camera at the end of a tube, to directly examine the colon (sigmoidoscopy or colonoscopy), can detect the earliest forms of colorectal cancer. Talk to your health care provider about this at age 50, when routine screening begins. Direct exam of the colon should be repeated every 5-10 years through age 75 years, unless early forms of pre-cancerous polyps or small growths are found.  Osteoporosis is a disease in which the bones lose minerals and strength with aging.  This can result in serious bone fractures or breaks. The risk of osteoporosis can be identified using a bone density scan. Women ages 65 years and over and women at risk for fractures or osteoporosis should discuss screening with their health care providers. Ask your health care provider whether you should take a calcium supplement or vitamin D to reduce the rate of osteoporosis.  Menopause can be associated with physical symptoms and risks. Hormone replacement therapy is available to decrease symptoms and risks. You should talk to your   health care provider about whether hormone replacement therapy is right for you.  Use sunscreen. Apply sunscreen liberally and repeatedly throughout the day. You should seek shade when your shadow is shorter than you. Protect yourself by wearing long sleeves, pants, a wide-brimmed hat, and sunglasses year round, whenever you are outdoors.  Once a month, do a whole body skin exam, using a mirror to look at the skin on your back. Tell your health care provider of new moles, moles that have irregular borders, moles that are larger than a pencil eraser, or moles that have changed in shape or color.  Stay current with required vaccines (immunizations).  Influenza vaccine. All adults should be immunized every year.  Tetanus, diphtheria, and acellular pertussis (Td, Tdap) vaccine. Pregnant women should receive 1 dose of Tdap vaccine during each pregnancy. The dose should be obtained regardless of the length of time since the last dose. Immunization is preferred during the 27th-36th week of gestation. An adult who has not previously received Tdap or who does not know her vaccine status should receive 1 dose of Tdap. This initial dose should be followed by tetanus and diphtheria toxoids (Td) booster doses every 10 years. Adults with an unknown or incomplete history of completing a 3-dose immunization series with Td-containing vaccines should begin or complete a primary  immunization series including a Tdap dose. Adults should receive a Td booster every 10 years.    Zoster vaccine. One dose is recommended for adults aged 60 years or older unless certain conditions are present.    Pneumococcal 13-valent conjugate (PCV13) vaccine. When indicated, a person who is uncertain of her immunization history and has no record of immunization should receive the PCV13 vaccine. An adult aged 19 years or older who has certain medical conditions and has not been previously immunized should receive 1 dose of PCV13 vaccine. This PCV13 should be followed with a dose of pneumococcal polysaccharide (PPSV23) vaccine. The PPSV23 vaccine dose should be obtained at least 8 weeks after the dose of PCV13 vaccine. An adult aged 19 years or older who has certain medical conditions and previously received 1 or more doses of PPSV23 vaccine should receive 1 dose of PCV13. The PCV13 vaccine dose should be obtained 1 or more years after the last PPSV23 vaccine dose.    Pneumococcal polysaccharide (PPSV23) vaccine. When PCV13 is also indicated, PCV13 should be obtained first. All adults aged 65 years and older should be immunized. An adult younger than age 65 years who has certain medical conditions should be immunized. Any person who resides in a nursing home or long-term care facility should be immunized. An adult smoker should be immunized. People with an immunocompromised condition and certain other conditions should receive both PCV13 and PPSV23 vaccines. People with human immunodeficiency virus (HIV) infection should be immunized as soon as possible after diagnosis. Immunization during chemotherapy or radiation therapy should be avoided. Routine use of PPSV23 vaccine is not recommended for American Indians, [place] Natives, or people younger than 65 years unless there are medical conditions that require PPSV23 vaccine. When indicated, people who have unknown immunization and have no record of  immunization should receive PPSV23 vaccine. One-time revaccination 5 years after the first dose of PPSV23 is recommended for people aged 19-64 years who have chronic kidney failure, nephrotic syndrome, asplenia, or immunocompromised conditions. People who received 1-2 doses of PPSV23 before age 65 years should receive another dose of PPSV23 vaccine at age 65 years or later if at least 5 years   have passed since the previous dose. Doses of PPSV23 are not needed for people immunized with PPSV23 at or after age 65 years.   Preventive Services / Frequency  Ages 65 years and over  Blood pressure check.  Lipid and cholesterol check.  Lung cancer screening. / Every year if you are aged 55-80 years and have a 30-pack-year history of smoking and currently smoke or have quit within the past 15 years. Yearly screening is stopped once you have quit smoking for at least 15 years or develop a health problem that would prevent you from having lung cancer treatment.  Clinical breast exam.** / Every year after age 40 years.  BRCA-related cancer risk assessment.** / For women who have family members with a BRCA-related cancer (breast, ovarian, tubal, or peritoneal cancers).  Mammogram.** / Every year beginning at age 40 years and continuing for as long as you are in good health. Consult with your health care provider.  Pap test.** / Every 3 years starting at age 30 years through age 65 or 70 years with 3 consecutive normal Pap tests. Testing can be stopped between 65 and 70 years with 3 consecutive normal Pap tests and no abnormal Pap or HPV tests in the past 10 years.  Fecal occult blood test (FOBT) of stool. / Every year beginning at age 50 years and continuing until age 75 years. You may not need to do this test if you get a colonoscopy every 10 years.  Flexible sigmoidoscopy or colonoscopy.** / Every 5 years for a flexible sigmoidoscopy or every 10 years for a colonoscopy beginning at age 50 years and  continuing until age 75 years.  Hepatitis C blood test.** / For all people born from 1945 through 1965 and any individual with known risks for hepatitis C.  Osteoporosis screening.** / A one-time screening for women ages 65 years and over and women at risk for fractures or osteoporosis.  Skin self-exam. / Monthly.  Influenza vaccine. / Every year.  Tetanus, diphtheria, and acellular pertussis (Tdap/Td) vaccine.** / 1 dose of Td every 10 years.  Zoster vaccine.** / 1 dose for adults aged 60 years or older.  Pneumococcal 13-valent conjugate (PCV13) vaccine.** / Consult your health care provider.  Pneumococcal polysaccharide (PPSV23) vaccine.** / 1 dose for all adults aged 65 years and older. Screening for abdominal aortic aneurysm (AAA)  by ultrasound is recommended for people who have history of high blood pressure or who are current or former smokers. 

## 2015-03-05 NOTE — Progress Notes (Signed)
Patient ID: [name], female   DOB: 1931/06/17, 81 y.o.   MRN: [id number]  Annual Comprehensive Examination  This very nice 81 y.o.female presents for complete physical.  Patient has been followed for HTN, Prediabetes, Hyperlipidemia, and Vitamin D Deficiency.    HTN predates since 1980 when she presented with  a SAH and in 5681 she had an embolic Left Brain CVA and recovered, but was found to be in Afib later determined to be chronic. 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 Patient had been on coumadin for years and was recently transitioned to Eliquis. Patient's BP has been controlled at home and patient denies any cardiac symptoms as chest pain, palpitations, shortness of breath, dizziness or ankle swelling. Today's BP: 120/74 mmHg    Patient's hyperlipidemia is controlled with diet and medications. Patient denies myalgias or other medication SE's. Last lipids were at goal - Total  Chol,177; HDL55; LDL 97; Trig 125 on 12/05/2014.   Patient has prediabetes predating since July 2010 with A1c 6.5% and patient attempts to manage this by diet. Patient denies reactive hypoglycemic symptoms, visual blurring, diabetic polys, or paresthesias. Last A1c was 6.2% on 12/05/2014.      Finally, patient has history of Vitamin D Deficiency  Of "12" in 2008 and last Vitamin D was 73 on 12/05/2014.      Medication Sig  . apixaban (ELIQUIS) 5 MG TABS tablet Take 1 tablet  2  times daily.  Marland Kitchen b complex vitamins tablet Take 1 tab daily.  . bisoprolol-hydrochlorothiazide (ZIAC) 5-6.25 MG per tablet Take 1 tablet by mouth daily. )  . Cholecalciferol (VITAMIN D3) 5000 UNITS CAPS Take 5,000 Units by mouth 2 (two) times a week. Takes on Monday and Friday per patient)  . CRESTOR 40 MG tablet TAKE 1 TABLET DAILY  . cyclobenzaprine (FLEXERIL) 10 MG tablet Take 1 tablet (10 mg total) by mouth every 8 (eight) hours as needed for muscle spasms.  Marland Kitchen HYDROcodone-acetaminophen  (NORCO/VICODIN) 5-325 MG per tablet Take 0.5-1 tablets by mouth every 3 (three) hours as needed for moderate pain.  Marland Kitchen levETIRAcetam (KEPPRA) 500 MG tablet TAKE 1 TABLET THREE TIMES A DAY  . Magnesium 400 MG CAPS Take 400 mg by mouth 3 (three) times daily.   . ondansetron (ZOFRAN-ODT) 8 MG disintegrating tablet Take 1 tablet (8 mg total) by mouth every 8 (eight) hours as needed for nausea.  Marland Kitchen OVER THE COUNTER MEDICATION Take 1 tablet by mouth every morning. Slow release iron  . polyethylene glycol (MIRALAX / GLYCOLAX) packet Take 17 g by mouth daily.  . potassium chloride SA (K-DUR,KLOR-CON) 20 MEQ tablet TAKE 1 TABLET THREE TIMES A DAY  . rOPINIRole (REQUIP) 2 MG tablet TAKE 1 TABLET AT BEDTIME  . furosemide (LASIX) 40 MG tablet Take 40 mg by mouth 2 (two) times daily. prn  . levothyroxine (SYNTHROID, LEVOTHROID) 88 MCG tablet Take 88 mcg by mouth daily before breakfast.  . pantoprazole (PROTONIX) 40 MG tablet Take 40 mg by mouth 2 (two) times daily.    Allergies  Allergen Reactions  . Ace Inhibitors   . Atenolol   . Lyrica [Pregabalin] Other (See Comments)  . Trazodone And Nefazodone   . Adhesive [Tape] Rash   Past Medical History  Diagnosis Date  . Thyroid disease   . GERD (gastroesophageal reflux disease)   . Arthritis     knees  . Anemia   . Cancer     throid ca  .  CHF (congestive heart failure)   . Shortness of breath   . Hypertension   . Hyperlipidemia   . Elevated hemoglobin A1c   . SAH (subarachnoid hemorrhage) 1973  . Vitamin D deficiency   . Hypothyroid   . RLS (restless legs syndrome)   . Stroke 6-7 yrs ago  . Sleep apnea     cannot tolerate cpap  . Seizures aug 2015    mild with arm jerking  . History of blood transfusion 2013   Health Maintenance  Topic Date Due  . OPHTHALMOLOGY EXAM  08/24/1943  . DEXA SCAN  08/23/1998  . TETANUS/TDAP  10/25/2013  . URINE MICROALBUMIN  03/01/2015  . INFLUENZA VACCINE  05/26/2015  . HEMOGLOBIN A1C  06/05/2015  . FOOT  EXAM  12/06/2015  . COLONOSCOPY  05/25/2021  . ZOSTAVAX  Completed  . PNA vac Low Risk Adult  Completed   Immunization History  Administered Date(s) Administered  . DT 09/04/2014  . Influenza, High Dose Seasonal PF 07/23/2014  . Influenza-Unspecified 08/09/2013  . Pneumococcal Conjugate-13 06/10/2014  . Pneumococcal-Unspecified 10/25/2004  . Td 10/26/2003  . Zoster 10/26/2007   Past Surgical History  Procedure Laterality Date  . Abdominal hysterectomy    . Tonsillectomy    . Throidectomy      complete  . Knee surgery Bilateral     meniscus  . Esophagogastroduodenoscopy  07/03/2012    Procedure: ESOPHAGOGASTRODUODENOSCOPY (EGD);  Surgeon: Arta Silence, MD;  Location: [facility];  Service: Endoscopy;  Laterality: N/A;  . Appendectomy  1960  . Eye surgery Bilateral     lens replacments for cataracts  . Esophagogastroduodenoscopy (egd) with propofol N/A 07/30/2014    Procedure: ESOPHAGOGASTRODUODENOSCOPY (EGD) WITH PROPOFOL;  Surgeon: Jeryl Columbia, MD;  Location: [facility];  Service: Endoscopy;  Laterality: N/A;  . Hot hemostasis N/A 07/30/2014    Procedure: HOT HEMOSTASIS (ARGON PLASMA COAGULATION/BICAP);  Surgeon: Jeryl Columbia, MD;  Location: [facility];  Service: Endoscopy;  Laterality: N/A;   Family History  Problem Relation Age of Onset  . Heart attack Mother   . Diabetes Mother   . Heart disease Mother   . Heart attack Father   . Hypertension Father   . Heart disease Father   . Heart disease Sister   . CVA Sister   . Heart disease Brother   . Heart disease Brother   . Cancer Brother     colon  . Hyperlipidemia Brother   . Heart disease Sister    History  Substance Use Topics  . Smoking status: Former Smoker -- 1.00 packs/day for 20 years    Types: Cigarettes    Quit date: 11/03/1984  . Smokeless tobacco: Never Used  . Alcohol Use: No    ROS Constitutional: Denies fever, chills, weight loss/gain, headaches, insomnia,  night sweats, and change in  appetite. Does c/o fatigue. Eyes: Denies redness, blurred vision, diplopia, discharge, itchy, watery eyes.  ENT: Denies discharge, congestion, post nasal drip, epistaxis, sore throat, earache, hearing loss, dental pain, Tinnitus, Vertigo, Sinus pain, snoring.  Cardio: Denies chest pain, palpitations, irregular heartbeat, syncope, dyspnea, diaphoresis, orthopnea, PND, claudication, edema Respiratory: denies cough, dyspnea, DOE, pleurisy, hoarseness, laryngitis, wheezing.  Gastrointestinal: Denies dysphagia, heartburn, reflux, water brash, pain, cramps, nausea, vomiting, bloating, diarrhea, constipation, hematemesis, melena, hematochezia, jaundice, hemorrhoids Genitourinary: Denies dysuria, frequency, urgency, nocturia, hesitancy, discharge, hematuria, flank pain Breast: Breast lumps, nipple discharge, bleeding.  Musculoskeletal: Denies arthralgia, myalgia, stiffness, Jt. Swelling, pain, limp, and strain/sprain. Denies falls. Skin: Denies puritis,  rash, hives, warts, acne, eczema, changing in skin lesion Neuro: No weakness, tremor, incoordination, spasms, paresthesia, pain Psychiatric: Denies confusion, memory loss, sensory loss. Denies Depression. Endocrine: Denies change in weight, skin, hair change, nocturia, and paresthesia, diabetic polys, visual blurring, hyper / hypo glycemic episodes.  Heme/Lymph: No excessive bleeding, bruising, enlarged lymph nodes.  Physical Exam  BP 120/74  Pulse 80  Temp97.3 F   Resp 16  Ht 5' 3.5" Wt 237 lb 12.8 oz     BMI 41.46   General Appearance: Well nourished and in no apparent distress. Eyes: PERRLA, EOMs, conjunctiva no swelling or erythema, normal fundi and vessels. Sinuses: No frontal/maxillary tenderness ENT/Mouth: EACs patent / TMs  nl. Nares clear without erythema, swelling, mucoid exudates. Oral hygiene is good. No erythema, swelling, or exudate. Tongue normal, non-obstructing. Tonsils not swollen or erythematous. Hearing normal.  Neck: Supple,  thyroid normal. No bruits, nodes or JVD. Respiratory: Respiratory effort normal.  BS equal and clear bilateral without rales, rhonci, wheezing or stridor. Cardio: Heart sounds are normal with regular rate and rhythm and no murmurs, rubs or gallops. Peripheral pulses are normal and equal bilaterally without edema. No aortic or femoral bruits. Chest: symmetric with normal excursions and percussion. Breasts: Symmetric, without lumps, nipple discharge, retractions, or fibrocystic changes.  Abdomen: Flat, soft, with bowl sounds. Nontender, no guarding, rebound, hernias, masses, or organomegaly.  Lymphatics: Non tender without lymphadenopathy.  Genitourinary:  Musculoskeletal: Full ROM all peripheral extremities, joint stability, 5/5 strength, and normal gait. Skin: Warm and dry without rashes, lesions, cyanosis, clubbing or  ecchymosis.  Neuro: Cranial nerves intact, reflexes equal bilaterally. Normal muscle tone, no cerebellar symptoms. Sensation intact.  Pysch: Awake and oriented X 3, normal affect, Insight and Judgment appropriate.   Assessment and Plan  1. Essential hypertension  - Microalbumin / creatinine urine ratio - EKG 12-Lead - [place], RETROPERITNL ABD,  LTD - furosemide (LASIX) 40 MG tablet; Take 1 tablet 2 x daily as directed for BP & Fluid / Swelling  Dispense: 180 tablet; Refill: 99  2. Hyperlipidemia  - Lipid panel  3. PreDiabetes  - Hemoglobin A1c - Insulin, random  4. Vitamin D deficiency  - Vit D  25 hydroxy  5. Hypothyroidism  - Levetiracetam level - levothyroxine (SYNTHROID, LEVOTHROID) 100 MCG tablet; Take 1 tablet daily on an empty stomach for 30 minutes  Dispense: 90 tablet; Refill: 99  6. Morbid obesity   7. Seizures   8. Chronic a-fib   9. Screening for rectal cancer   10. Depression screen   11. At low risk for fall   12. Medication management  - Urine Microscopic - CBC with Differential/Platelet - BASIC METABOLIC PANEL WITH GFR -  Hepatic function panel - Magnesium  13. Gastroesophageal reflux disease, esophagitis presence not specified  - pantoprazole (PROTONIX) 40 MG tablet; Take 1 tablet daily for acid indigestion & acid reflux  Dispense: 90 tablet; Refill: 99 - ranitidine (ZANTAC) 300 MG tablet; Take 1 to 2 tablets daily for indigestion/heartburn to allow wean off of pantoprazole / Protonix  Dispense: 180 tablet; Refill: 99   Continue prudent diet as discussed, weight control, BP monitoring, regular exercise, and medications. Discussed med's effects and SE's. Screening labs and tests as requested with regular follow-up as recommended. Over 40 minutes of exam, counseling, chart review was performed.

## 2015-03-06 LAB — CBC WITH DIFFERENTIAL/PLATELET
BASOS ABS: 0 10*3/uL (ref 0.0–0.1)
Basophils Relative: 0 % (ref 0–1)
EOS PCT: 2 % (ref 0–5)
Eosinophils Absolute: 0.2 10*3/uL (ref 0.0–0.7)
HCT: 42.2 % (ref 36.0–46.0)
Hemoglobin: 14 g/dL (ref 12.0–15.0)
Lymphocytes Relative: 30 % (ref 12–46)
Lymphs Abs: 2.6 10*3/uL (ref 0.7–4.0)
MCH: 29.5 pg (ref 26.0–34.0)
MCHC: 33.2 g/dL (ref 30.0–36.0)
MCV: 88.8 fL (ref 78.0–100.0)
MPV: 9.4 fL (ref 8.6–12.4)
Monocytes Absolute: 0.7 10*3/uL (ref 0.1–1.0)
Monocytes Relative: 8 % (ref 3–12)
NEUTROS ABS: 5.1 10*3/uL (ref 1.7–7.7)
Neutrophils Relative %: 60 % (ref 43–77)
Platelets: 290 10*3/uL (ref 150–400)
RBC: 4.75 MIL/uL (ref 3.87–5.11)
RDW: 14.5 % (ref 11.5–15.5)
WBC: 8.5 10*3/uL (ref 4.0–10.5)

## 2015-03-06 LAB — URINALYSIS, MICROSCOPIC ONLY
CASTS: NONE SEEN
CRYSTALS: NONE SEEN

## 2015-03-06 LAB — MICROALBUMIN / CREATININE URINE RATIO
CREATININE, URINE: 57.6 mg/dL
MICROALB UR: 12.9 mg/dL — AB (ref <2.0)
MICROALB/CREAT RATIO: 224 mg/g — AB (ref 0.0–30.0)

## 2015-03-06 LAB — INSULIN, RANDOM: INSULIN: 7 u[IU]/mL (ref 2.0–19.6)

## 2015-03-06 LAB — VITAMIN D 25 HYDROXY (VIT D DEFICIENCY, FRACTURES): Vit D, 25-Hydroxy: 66 ng/mL (ref 30–100)

## 2015-03-07 ENCOUNTER — Other Ambulatory Visit: Payer: Self-pay | Admitting: *Deleted

## 2015-03-07 ENCOUNTER — Other Ambulatory Visit: Payer: Self-pay | Admitting: Internal Medicine

## 2015-03-07 DIAGNOSIS — K219 Gastro-esophageal reflux disease without esophagitis: Secondary | ICD-10-CM

## 2015-03-07 DIAGNOSIS — N390 Urinary tract infection, site not specified: Secondary | ICD-10-CM

## 2015-03-07 MED ORDER — RANITIDINE HCL 300 MG PO TABS: ORAL_TABLET | ORAL | Status: AC

## 2015-03-07 MED ORDER — CIPROFLOXACIN HCL 250 MG PO TABS: 250.0000 mg | ORAL_TABLET | Freq: Two times a day (BID) | ORAL | Status: DC

## 2015-03-10 LAB — LEVETIRACETAM LEVEL: Keppra (Levetiracetam): 42.2 ug/mL

## 2015-03-14 ENCOUNTER — Other Ambulatory Visit: Payer: Self-pay | Admitting: *Deleted

## 2015-03-14 DIAGNOSIS — Z1212 Encounter for screening for malignant neoplasm of rectum: Secondary | ICD-10-CM

## 2015-03-14 LAB — POC HEMOCCULT BLD/STL (HOME/3-CARD/SCREEN)
Card #2 Fecal Occult Blod, POC: NEGATIVE
Card #3 Fecal Occult Blood, POC: NEGATIVE
Fecal Occult Blood, POC: NEGATIVE

## 2015-03-30 ENCOUNTER — Other Ambulatory Visit: Payer: Self-pay | Admitting: Internal Medicine

## 2015-04-10 ENCOUNTER — Ambulatory Visit (INDEPENDENT_AMBULATORY_CARE_PROVIDER_SITE_OTHER): Payer: Medicare Other

## 2015-04-10 DIAGNOSIS — N390 Urinary tract infection, site not specified: Secondary | ICD-10-CM

## 2015-04-10 NOTE — Progress Notes (Signed)
Patient ID: [name], female   DOB: 21-Aug-1931, 81 y.o.   MRN: [id number] Patient here today for a 30 day recheck of abnormal urine. UA and Culture ordered.

## 2015-04-11 LAB — URINALYSIS, COMPLETE
Bilirubin Urine: NEGATIVE
Casts: NONE SEEN
GLUCOSE, UA: NEGATIVE mg/dL
HGB URINE DIPSTICK: NEGATIVE
Ketones, ur: NEGATIVE mg/dL
Nitrite: NEGATIVE
PROTEIN: 30 mg/dL — AB
Specific Gravity, Urine: 1.02 (ref 1.005–1.030)
Urobilinogen, UA: 0.2 mg/dL (ref 0.0–1.0)
pH: 8 (ref 5.0–8.0)

## 2015-04-11 LAB — URINE CULTURE
COLONY COUNT: NO GROWTH
ORGANISM ID, BACTERIA: NO GROWTH

## 2015-06-17 ENCOUNTER — Encounter: Payer: Self-pay | Admitting: Internal Medicine

## 2015-06-17 ENCOUNTER — Ambulatory Visit (INDEPENDENT_AMBULATORY_CARE_PROVIDER_SITE_OTHER): Payer: Medicare Other | Admitting: Internal Medicine

## 2015-06-17 VITALS — BP 118/64 | HR 66 | Temp 98.2°F | Resp 18 | Ht 63.5 in | Wt 241.0 lb

## 2015-06-17 DIAGNOSIS — I10 Essential (primary) hypertension: Secondary | ICD-10-CM

## 2015-06-17 DIAGNOSIS — E559 Vitamin D deficiency, unspecified: Secondary | ICD-10-CM

## 2015-06-17 DIAGNOSIS — E039 Hypothyroidism, unspecified: Secondary | ICD-10-CM | POA: Diagnosis not present

## 2015-06-17 DIAGNOSIS — E785 Hyperlipidemia, unspecified: Secondary | ICD-10-CM

## 2015-06-17 DIAGNOSIS — R7309 Other abnormal glucose: Secondary | ICD-10-CM | POA: Diagnosis not present

## 2015-06-17 DIAGNOSIS — Z79899 Other long term (current) drug therapy: Secondary | ICD-10-CM

## 2015-06-17 LAB — MAGNESIUM: Magnesium: 1.9 mg/dL (ref 1.5–2.5)

## 2015-06-17 LAB — CBC WITH DIFFERENTIAL/PLATELET
Basophils Absolute: 0 10*3/uL (ref 0.0–0.1)
Basophils Relative: 0 % (ref 0–1)
EOS PCT: 2 % (ref 0–5)
Eosinophils Absolute: 0.2 10*3/uL (ref 0.0–0.7)
HCT: 43.3 % (ref 36.0–46.0)
HEMOGLOBIN: 14.5 g/dL (ref 12.0–15.0)
Lymphocytes Relative: 29 % (ref 12–46)
Lymphs Abs: 2.4 10*3/uL (ref 0.7–4.0)
MCH: 29.7 pg (ref 26.0–34.0)
MCHC: 33.5 g/dL (ref 30.0–36.0)
MCV: 88.5 fL (ref 78.0–100.0)
MONO ABS: 0.6 10*3/uL (ref 0.1–1.0)
MPV: 9.9 fL (ref 8.6–12.4)
Monocytes Relative: 7 % (ref 3–12)
Neutro Abs: 5.1 10*3/uL (ref 1.7–7.7)
Neutrophils Relative %: 62 % (ref 43–77)
Platelets: 256 10*3/uL (ref 150–400)
RBC: 4.89 MIL/uL (ref 3.87–5.11)
RDW: 14.1 % (ref 11.5–15.5)
WBC: 8.2 10*3/uL (ref 4.0–10.5)

## 2015-06-17 LAB — BASIC METABOLIC PANEL WITH GFR
BUN: 12 mg/dL (ref 7–25)
CALCIUM: 9.3 mg/dL (ref 8.6–10.4)
CO2: 28 mmol/L (ref 20–31)
Chloride: 97 mmol/L — ABNORMAL LOW (ref 98–110)
Creat: 1.01 mg/dL — ABNORMAL HIGH (ref 0.60–0.88)
GFR, EST AFRICAN AMERICAN: 60 mL/min (ref >=60)
GFR, EST NON AFRICAN AMERICAN: 52 mL/min — AB (ref >=60)
Glucose, Bld: 94 mg/dL (ref 65–99)
Potassium: 3.8 mmol/L (ref 3.5–5.3)
Sodium: 138 mmol/L (ref 135–146)

## 2015-06-17 LAB — HEPATIC FUNCTION PANEL
ALT: 15 U/L (ref 6–29)
AST: 20 U/L (ref 10–35)
Albumin: 4.1 g/dL (ref 3.6–5.1)
Alkaline Phosphatase: 64 U/L (ref 33–130)
BILIRUBIN DIRECT: 0.1 mg/dL (ref <=0.2)
BILIRUBIN INDIRECT: 0.4 mg/dL (ref 0.2–1.2)
Total Bilirubin: 0.5 mg/dL (ref 0.2–1.2)
Total Protein: 6.5 g/dL (ref 6.1–8.1)

## 2015-06-17 LAB — TSH: TSH: 1.705 u[IU]/mL (ref 0.350–4.500)

## 2015-06-17 LAB — LIPID PANEL
CHOL/HDL RATIO: 2.9 ratio (ref <=5.0)
CHOLESTEROL: 162 mg/dL (ref 125–200)
HDL: 55 mg/dL (ref >=46)
LDL Cholesterol: 77 mg/dL (ref <130)
TRIGLYCERIDES: 148 mg/dL (ref <150)
VLDL: 30 mg/dL (ref <30)

## 2015-06-17 NOTE — Addendum Note (Signed)
Addended by: Starlyn Skeans A on: 06/17/2015 10:44 AM   Modules accepted: Orders

## 2015-06-17 NOTE — Patient Instructions (Signed)

## 2015-06-17 NOTE — Progress Notes (Signed)
Patient ID: [name], female   DOB: 1931-10-13, 81 y.o.   MRN: [id number]  Assessment and Plan:  Hypertension:  -Continue medication -monitor blood pressure at home. -Continue DASH diet -Reminder to go to the ER if any CP, SOB, nausea, dizziness, severe HA, changes vision/speech, left arm numbness and tingling and jaw pain.  Cholesterol - Continue diet and exercise -Check cholesterol.   Prediabetes -Continue diet and exercise.  -Check A1C  Vitamin D Def -check level -continue medications.   Continue diet and meds as discussed. Further disposition pending results of labs. Discussed med's effects and SE's.    HPI 81 y.o. female  presents for 3 month follow up with hypertension, hyperlipidemia, diabetes and vitamin D deficiency.   Her blood pressure has been controlled at home, today their BP is BP: 118/64 mmHg.She does workout. She denies chest pain, shortness of breath, dizziness.   She is on cholesterol medication and denies myalgias. Her cholesterol is at goal. The cholesterol was:  03/05/2015: Cholesterol 161; HDL 56; LDL Cholesterol 79; Triglycerides 132   She has been working on diet and exercise for diabetes without complications, she is not on bASA, she is on ACE/ARB, and denies  foot ulcerations, hyperglycemia, hypoglycemia , increased appetite, nausea, paresthesia of the feet, polydipsia, polyuria, visual disturbances, vomiting and weight loss. Last A1C was: 03/05/2015: Hgb A1c MFr Bld 6.2*   Patient is on Vitamin D supplement. 03/05/2015: Vit D, 25-Hydroxy 66    Current Medications:  Current Outpatient Prescriptions on File Prior to Visit  Medication Sig Dispense Refill  . apixaban (ELIQUIS) 5 MG TABS tablet Take 1 tablet (5 mg total) by mouth 2 (two) times daily. 180 tablet 4  . b complex vitamins tablet Take 1 tablet by mouth daily.    . bisoprolol-hydrochlorothiazide (ZIAC) 5-6.25 MG per tablet TAKE 1 TABLET EVERY MORNING 90 tablet 2  . Cholecalciferol  (VITAMIN D3) 5000 UNITS CAPS Take 5,000 Units by mouth every other day. (Patient taking differently: Take 5,000 Units by mouth 2 (two) times a week. Takes on Monday and Friday per patient)    . CRESTOR 40 MG tablet TAKE 1 TABLET DAILY 90 tablet 2  . furosemide (LASIX) 40 MG tablet Take 1 tablet 2 x daily as directed for BP & Fluid / Swelling 180 tablet 99  . HYDROcodone-acetaminophen (NORCO/VICODIN) 5-325 MG per tablet Take 0.5-1 tablets by mouth every 3 (three) hours as needed for moderate pain.    Marland Kitchen levETIRAcetam (KEPPRA) 500 MG tablet TAKE 1 TABLET THREE TIMES A DAY 270 tablet 2  . levothyroxine (SYNTHROID, LEVOTHROID) 100 MCG tablet Take 1 tablet daily on an empty stomach for 30 minutes 90 tablet 99  . Magnesium 400 MG CAPS Take 400 mg by mouth 3 (three) times daily.     . ondansetron (ZOFRAN-ODT) 8 MG disintegrating tablet Take 1 tablet (8 mg total) by mouth every 8 (eight) hours as needed for nausea. 20 tablet 0  . OVER THE COUNTER MEDICATION Take 1 tablet by mouth every morning. Slow release iron    . polyethylene glycol (MIRALAX / GLYCOLAX) packet Take 17 g by mouth daily.    . potassium chloride SA (K-DUR,KLOR-CON) 20 MEQ tablet TAKE 1 TABLET THREE TIMES A DAY 270 tablet 1  . ranitidine (ZANTAC) 300 MG tablet Take 1 to 2 tablets daily for indigestion/heartburn to allow wean off of pantoprazole / Protonix 180 tablet 3  . rOPINIRole (REQUIP) 2 MG tablet TAKE 1 TABLET AT BEDTIME 90 tablet 3  No current facility-administered medications on file prior to visit.   Medical History:  Past Medical History  Diagnosis Date  . Thyroid disease   . GERD (gastroesophageal reflux disease)   . Arthritis     knees  . Anemia   . Cancer     throid ca  . CHF (congestive heart failure)   . Shortness of breath   . Hypertension   . Hyperlipidemia   . Elevated hemoglobin A1c   . SAH (subarachnoid hemorrhage) 1973  . Vitamin D deficiency   . Hypothyroid   . RLS (restless legs syndrome)   . Stroke  6-7 yrs ago  . Sleep apnea     cannot tolerate cpap  . Seizures aug 2015    mild with arm jerking  . History of blood transfusion 2013   Allergies:  Allergies  Allergen Reactions  . Ace Inhibitors   . Atenolol   . Lyrica [Pregabalin] Other (See Comments)  . Trazodone And Nefazodone   . Adhesive [Tape] Rash     Review of Systems:  Review of Systems  Constitutional: Negative for fever, chills and malaise/fatigue.  HENT: Negative for congestion, ear pain and sore throat.   Eyes: Negative.   Respiratory: Negative for cough, shortness of breath and wheezing.   Cardiovascular: Negative for chest pain, palpitations and leg swelling.  Gastrointestinal: Negative for heartburn, diarrhea, constipation, blood in stool and melena.  Genitourinary: Negative.   Skin: Negative.   Neurological: Negative for dizziness, loss of consciousness and headaches.  Psychiatric/Behavioral: Negative for depression. The patient is not nervous/anxious and does not have insomnia.     Family history- Review and unchanged  Social history- Review and unchanged  Physical Exam: BP 118/64 mmHg  Pulse 66  Temp(Src) 98.2 F (36.8 C) (Temporal)  Resp 18  Ht 5' 3.5" (1.613 m)  Wt 241 lb (109.317 kg)  BMI 42.02 kg/m2 Wt Readings from Last 3 Encounters:  06/17/15 241 lb (109.317 kg)  03/05/15 237 lb 12.8 oz (107.865 kg)  01/07/15 243 lb (110.224 kg)   General Appearance: Well nourished well developed, non-toxic appearing, in no apparent distress. Eyes: PERRLA, EOMs, conjunctiva no swelling or erythema ENT/Mouth: Ear canals clear with no erythema, swelling, or discharge.  TMs normal bilaterally, oropharynx clear, moist, with no exudate.   Neck: Supple, thyroid normal, no JVD, no cervical adenopathy.  Respiratory: Respiratory effort normal, breath sounds clear A&P, no wheeze, rhonchi or rales noted.  No retractions, no accessory muscle usage Cardio: RRR with no RGs. 2/6 murmur No noted edema.  Abdomen: Soft,  + BS.  Non tender, no guarding, rebound, hernias, masses. Musculoskeletal: Full ROM, 5/5 strength, Normal gait with antanlgia.   Skin: Warm, dry without rashes, lesions, ecchymosis.  Neuro: Awake and oriented X 3, Cranial nerves intact. No cerebellar symptoms.  Psych: normal affect, Insight and Judgment appropriate.    Starlyn Skeans, PA-C 10:18 AM [facility]

## 2015-06-18 LAB — HEMOGLOBIN A1C
Hgb A1c MFr Bld: 6.4 % — ABNORMAL HIGH
Mean Plasma Glucose: 137 mg/dL — ABNORMAL HIGH

## 2015-06-18 LAB — VITAMIN D 25 HYDROXY (VIT D DEFICIENCY, FRACTURES): Vit D, 25-Hydroxy: 61 ng/mL (ref 30–100)

## 2015-06-18 LAB — INSULIN, RANDOM: Insulin: 5 u[IU]/mL (ref 2.0–19.6)

## 2015-07-21 ENCOUNTER — Other Ambulatory Visit: Payer: Self-pay | Admitting: Internal Medicine

## 2015-07-21 ENCOUNTER — Ambulatory Visit (INDEPENDENT_AMBULATORY_CARE_PROVIDER_SITE_OTHER): Payer: Medicare Other | Admitting: *Deleted

## 2015-07-21 DIAGNOSIS — Z23 Encounter for immunization: Secondary | ICD-10-CM | POA: Diagnosis not present

## 2015-08-07 ENCOUNTER — Other Ambulatory Visit: Payer: Self-pay | Admitting: Internal Medicine

## 2015-09-23 ENCOUNTER — Ambulatory Visit (INDEPENDENT_AMBULATORY_CARE_PROVIDER_SITE_OTHER): Payer: Medicare Other | Admitting: Internal Medicine

## 2015-09-23 ENCOUNTER — Encounter: Payer: Self-pay | Admitting: Internal Medicine

## 2015-09-23 VITALS — BP 114/80 | HR 64 | Temp 97.0°F | Resp 16 | Ht 64.0 in | Wt 239.4 lb

## 2015-09-23 DIAGNOSIS — E785 Hyperlipidemia, unspecified: Secondary | ICD-10-CM | POA: Diagnosis not present

## 2015-09-23 DIAGNOSIS — E559 Vitamin D deficiency, unspecified: Secondary | ICD-10-CM | POA: Diagnosis not present

## 2015-09-23 DIAGNOSIS — R7309 Other abnormal glucose: Secondary | ICD-10-CM

## 2015-09-23 DIAGNOSIS — I10 Essential (primary) hypertension: Secondary | ICD-10-CM | POA: Diagnosis not present

## 2015-09-23 DIAGNOSIS — E039 Hypothyroidism, unspecified: Secondary | ICD-10-CM | POA: Diagnosis not present

## 2015-09-23 DIAGNOSIS — Z79899 Other long term (current) drug therapy: Secondary | ICD-10-CM

## 2015-09-23 LAB — CBC WITH DIFFERENTIAL/PLATELET
BASOS ABS: 0.1 10*3/uL (ref 0.0–0.1)
BASOS PCT: 1 % (ref 0–1)
Eosinophils Absolute: 0.2 10*3/uL (ref 0.0–0.7)
Eosinophils Relative: 2 % (ref 0–5)
HEMATOCRIT: 43.9 % (ref 36.0–46.0)
HEMOGLOBIN: 14.8 g/dL (ref 12.0–15.0)
LYMPHS PCT: 36 % (ref 12–46)
Lymphs Abs: 3 10*3/uL (ref 0.7–4.0)
MCH: 30.1 pg (ref 26.0–34.0)
MCHC: 33.7 g/dL (ref 30.0–36.0)
MCV: 89.4 fL (ref 78.0–100.0)
MPV: 9.8 fL (ref 8.6–12.4)
Monocytes Absolute: 0.7 10*3/uL (ref 0.1–1.0)
Monocytes Relative: 8 % (ref 3–12)
NEUTROS ABS: 4.3 10*3/uL (ref 1.7–7.7)
Neutrophils Relative %: 53 % (ref 43–77)
Platelets: 280 10*3/uL (ref 150–400)
RBC: 4.91 MIL/uL (ref 3.87–5.11)
RDW: 13.6 % (ref 11.5–15.5)
WBC: 8.2 10*3/uL (ref 4.0–10.5)

## 2015-09-23 LAB — HEMOGLOBIN A1C
HEMOGLOBIN A1C: 6.1 % — AB (ref <5.7)
MEAN PLASMA GLUCOSE: 128 mg/dL — AB (ref <117)

## 2015-09-23 NOTE — Progress Notes (Signed)
Patient ID: [name], female   DOB: 05-02-1931, 81 y.o.   MRN: [id number]  Assessment and Plan:  Hypertension:  -Continue medication,  -monitor blood pressure at home.  -Continue DASH diet.   -Reminder to go to the ER if any CP, SOB, nausea, dizziness, severe HA, changes vision/speech, left arm numbness and tingling, and jaw pain.  Cholesterol: -Continue diet and exercise.  -Check cholesterol.   Pre-diabetes: -Continue diet and exercise.  -Check A1C  Vitamin D Def: -check level -continue medications.   Continue diet and meds as discussed. Further disposition pending results of labs.  HPI 81 y.o. female  presents for 3 month follow up with hypertension, hyperlipidemia, prediabetes and vitamin D.   Her blood pressure has been controlled at home, today their BP is BP: 114/80 mmHg.   She does not workout. She denies chest pain, shortness of breath, dizziness. She reports that when she walks to fast she gets short of breath.     She is on cholesterol medication and denies myalgias. Her cholesterol is at goal. The cholesterol last visit was:   Lab Results  Component Value Date   CHOL 162 06/17/2015   HDL 55 06/17/2015   LDLCALC 77 06/17/2015   TRIG 148 06/17/2015   CHOLHDL 2.9 06/17/2015     She has been working on diet and exercise for prediabetes, and denies foot ulcerations, hyperglycemia, hypoglycemia , increased appetite, nausea, paresthesia of the feet, polydipsia, polyuria, visual disturbances, vomiting and weight loss. Last A1C in the office was:  Lab Results  Component Value Date   HGBA1C 6.4* 06/17/2015    Patient is on Vitamin D supplement.  Lab Results  Component Value Date   VD25OH 34 06/17/2015     She reports that she has been doing well with leg swelling and only occasionally needs to take an extra fluid pill.  She reports that she is at her dry weight.     Current Medications:  Current Outpatient Prescriptions on File Prior to Visit  Medication  Sig Dispense Refill  . apixaban (ELIQUIS) 5 MG TABS tablet Take 1 tablet (5 mg total) by mouth 2 (two) times daily. 180 tablet 4  . b complex vitamins tablet Take 1 tablet by mouth daily.    . bisoprolol-hydrochlorothiazide (ZIAC) 5-6.25 MG per tablet TAKE 1 TABLET EVERY MORNING 90 tablet 2  . Cholecalciferol (VITAMIN D3) 5000 UNITS CAPS Take 5,000 Units by mouth every other day. (Patient taking differently: Take 5,000 Units by mouth 2 (two) times a week. Takes on Monday and Friday per patient)    . CRESTOR 40 MG tablet TAKE 1 TABLET DAILY 90 tablet 2  . furosemide (LASIX) 40 MG tablet Take 1 tablet 2 x daily as directed for BP & Fluid / Swelling 180 tablet 99  . HYDROcodone-acetaminophen (NORCO/VICODIN) 5-325 MG per tablet Take 0.5-1 tablets by mouth every 3 (three) hours as needed for moderate pain.    Marland Kitchen levETIRAcetam (KEPPRA) 500 MG tablet TAKE 1 TABLET THREE TIMES A DAY 270 tablet 2  . levothyroxine (SYNTHROID, LEVOTHROID) 100 MCG tablet Take 1 tablet daily on an empty stomach for 30 minutes 90 tablet 99  . Magnesium 400 MG CAPS Take 400 mg by mouth 3 (three) times daily.     . ondansetron (ZOFRAN-ODT) 8 MG disintegrating tablet Take 1 tablet (8 mg total) by mouth every 8 (eight) hours as needed for nausea. 20 tablet 0  . OVER THE COUNTER MEDICATION Take 1 tablet by mouth every morning.  Slow release iron    . polyethylene glycol (MIRALAX / GLYCOLAX) packet Take 17 g by mouth daily.    . potassium chloride SA (K-DUR,KLOR-CON) 20 MEQ tablet TAKE 1 TABLET THREE TIMES A DAY 270 tablet 0  . ranitidine (ZANTAC) 300 MG tablet Take 1 to 2 tablets daily for indigestion/heartburn to allow wean off of pantoprazole / Protonix 180 tablet 3  . rOPINIRole (REQUIP) 2 MG tablet TAKE 1 TABLET AT BEDTIME 90 tablet 3   No current facility-administered medications on file prior to visit.    Medical History:  Past Medical History  Diagnosis Date  . Thyroid disease   . GERD (gastroesophageal reflux disease)    . Arthritis     knees  . Anemia   . Cancer ([place])     throid ca  . CHF (congestive heart failure) ([place])   . Shortness of breath   . Hypertension   . Hyperlipidemia   . Elevated hemoglobin A1c   . SAH (subarachnoid hemorrhage) ([place]) 1973  . Vitamin D deficiency   . Hypothyroid   . RLS (restless legs syndrome)   . Stroke ([place]) 6-7 yrs ago  . Sleep apnea     cannot tolerate cpap  . Seizures ([place]) aug 2015    mild with arm jerking  . History of blood transfusion 2013    Allergies:  Allergies  Allergen Reactions  . Ace Inhibitors   . Atenolol   . Lyrica [Pregabalin] Other (See Comments)  . Trazodone And Nefazodone   . Adhesive [Tape] Rash     Review of Systems:  Review of Systems  Constitutional: Negative for fever and chills.  HENT: Positive for congestion. Negative for sore throat.   Respiratory: Positive for cough. Negative for shortness of breath and wheezing.   Cardiovascular: Negative for chest pain, palpitations and leg swelling.  Gastrointestinal: Negative for heartburn, diarrhea, constipation, blood in stool and melena.  Genitourinary: Negative.   Neurological: Positive for dizziness. Negative for sensory change and loss of consciousness.  Psychiatric/Behavioral: Negative for depression. The patient is not nervous/anxious and does not have insomnia.     Family history- Review and unchanged  Social history- Review and unchanged  Physical Exam: BP 114/80 mmHg  Pulse 64  Temp(Src) 97 F (36.1 C)  Resp 16  Ht 5\' 4"  (1.626 m)  Wt 239 lb 6.4 oz (108.591 kg)  BMI 41.07 kg/m2 Wt Readings from Last 3 Encounters:  09/23/15 239 lb 6.4 oz (108.591 kg)  06/17/15 241 lb (109.317 kg)  03/05/15 237 lb 12.8 oz (107.865 kg)    General Appearance: Well nourished well developed, in no apparent distress. Eyes: PERRLA, EOMs, conjunctiva no swelling or erythema ENT/Mouth: Ear canals normal without obstruction, swelling, erythma, discharge.  TMs normal bilaterally.   Oropharynx moist, clear, without exudate, or postoropharyngeal swelling. Neck: Supple, thyroid normal,no cervical adenopathy  Respiratory: Respiratory effort normal, Breath sounds clear A&P without rhonchi, wheeze, or rale.  No retractions, no accessory usage. Cardio: RRR with no MRGs. Brisk peripheral pulses without edema. Chronic mild venous stasis changes Abdomen: Soft, + BS,  Non tender, no guarding, rebound, hernias, masses. Musculoskeletal: Full ROM, 5/5 strength, walks with a walker Skin: Warm, dry without rashes, lesions, ecchymosis.  Neuro: Awake and oriented X 3, Cranial nerves intact. Normal muscle tone, no cerebellar symptoms. Psych: Normal affect, Insight and Judgment appropriate.    Starlyn Skeans, PA-C 3:10 PM [facility]

## 2015-09-23 NOTE — Patient Instructions (Signed)
Please cut ziac in half.  Your target blood pressure range is 130-140/75-85.  If you are consistently running 150/90 or greater call the office.

## 2015-09-24 LAB — BASIC METABOLIC PANEL WITH GFR
BUN: 22 mg/dL (ref 7–25)
CO2: 33 mmol/L — AB (ref 20–31)
Calcium: 9.6 mg/dL (ref 8.6–10.4)
Chloride: 94 mmol/L — ABNORMAL LOW (ref 98–110)
Creat: 1.05 mg/dL — ABNORMAL HIGH (ref 0.60–0.88)
GFR, EST AFRICAN AMERICAN: 57 mL/min — AB (ref >=60)
GFR, EST NON AFRICAN AMERICAN: 50 mL/min — AB (ref >=60)
GLUCOSE: 84 mg/dL (ref 65–99)
POTASSIUM: 3.8 mmol/L (ref 3.5–5.3)
SODIUM: 138 mmol/L (ref 135–146)

## 2015-09-24 LAB — LIPID PANEL
CHOLESTEROL: 194 mg/dL (ref 125–200)
HDL: 63 mg/dL (ref >=46)
LDL Cholesterol: 95 mg/dL (ref <130)
Total CHOL/HDL Ratio: 3.1 Ratio (ref <=5.0)
Triglycerides: 179 mg/dL — ABNORMAL HIGH (ref <150)
VLDL: 36 mg/dL — ABNORMAL HIGH (ref <30)

## 2015-09-24 LAB — HEPATIC FUNCTION PANEL
ALK PHOS: 67 U/L (ref 33–130)
ALT: 13 U/L (ref 6–29)
AST: 20 U/L (ref 10–35)
Albumin: 4 g/dL (ref 3.6–5.1)
BILIRUBIN DIRECT: 0.1 mg/dL (ref <=0.2)
BILIRUBIN INDIRECT: 0.3 mg/dL (ref 0.2–1.2)
TOTAL PROTEIN: 7.3 g/dL (ref 6.1–8.1)
Total Bilirubin: 0.4 mg/dL (ref 0.2–1.2)

## 2015-09-24 LAB — TSH: TSH: 3.035 u[IU]/mL (ref 0.350–4.500)

## 2015-10-20 ENCOUNTER — Encounter: Payer: Self-pay | Admitting: *Deleted

## 2015-11-19 ENCOUNTER — Other Ambulatory Visit: Payer: Self-pay | Admitting: Internal Medicine

## 2015-12-14 ENCOUNTER — Other Ambulatory Visit: Payer: Self-pay | Admitting: Physician Assistant

## 2015-12-24 ENCOUNTER — Other Ambulatory Visit: Payer: Self-pay | Admitting: Internal Medicine

## 2015-12-31 ENCOUNTER — Ambulatory Visit (INDEPENDENT_AMBULATORY_CARE_PROVIDER_SITE_OTHER): Payer: Medicare Other | Admitting: Internal Medicine

## 2015-12-31 ENCOUNTER — Encounter: Payer: Self-pay | Admitting: Internal Medicine

## 2015-12-31 VITALS — BP 136/84 | HR 72 | Temp 97.6°F | Resp 16 | Ht 64.0 in | Wt 240.8 lb

## 2015-12-31 DIAGNOSIS — E039 Hypothyroidism, unspecified: Secondary | ICD-10-CM

## 2015-12-31 DIAGNOSIS — E785 Hyperlipidemia, unspecified: Secondary | ICD-10-CM

## 2015-12-31 DIAGNOSIS — I10 Essential (primary) hypertension: Secondary | ICD-10-CM | POA: Diagnosis not present

## 2015-12-31 DIAGNOSIS — Z79899 Other long term (current) drug therapy: Secondary | ICD-10-CM

## 2015-12-31 DIAGNOSIS — E559 Vitamin D deficiency, unspecified: Secondary | ICD-10-CM

## 2015-12-31 DIAGNOSIS — R7309 Other abnormal glucose: Secondary | ICD-10-CM

## 2015-12-31 LAB — HEPATIC FUNCTION PANEL
ALBUMIN: 4.2 g/dL (ref 3.6–5.1)
ALT: 12 U/L (ref 6–29)
AST: 18 U/L (ref 10–35)
Alkaline Phosphatase: 67 U/L (ref 33–130)
BILIRUBIN TOTAL: 0.4 mg/dL (ref 0.2–1.2)
Bilirubin, Direct: 0.1 mg/dL (ref <=0.2)
Indirect Bilirubin: 0.3 mg/dL (ref 0.2–1.2)
TOTAL PROTEIN: 6.6 g/dL (ref 6.1–8.1)

## 2015-12-31 LAB — TSH: TSH: 3.13 m[IU]/L

## 2015-12-31 LAB — BASIC METABOLIC PANEL WITH GFR
BUN: 15 mg/dL (ref 7–25)
CHLORIDE: 97 mmol/L — AB (ref 98–110)
CO2: 31 mmol/L (ref 20–31)
CREATININE: 1.03 mg/dL — AB (ref 0.60–0.88)
Calcium: 9.2 mg/dL (ref 8.6–10.4)
GFR, Est African American: 58 mL/min — ABNORMAL LOW (ref >=60)
GFR, Est Non African American: 51 mL/min — ABNORMAL LOW (ref >=60)
Glucose, Bld: 86 mg/dL (ref 65–99)
Potassium: 3.6 mmol/L (ref 3.5–5.3)
Sodium: 139 mmol/L (ref 135–146)

## 2015-12-31 LAB — CBC WITH DIFFERENTIAL/PLATELET
Basophils Absolute: 0 10*3/uL (ref 0.0–0.1)
Basophils Relative: 0 % (ref 0–1)
EOS ABS: 0.2 10*3/uL (ref 0.0–0.7)
EOS PCT: 2 % (ref 0–5)
HCT: 41.9 % (ref 36.0–46.0)
Hemoglobin: 14 g/dL (ref 12.0–15.0)
LYMPHS ABS: 2.8 10*3/uL (ref 0.7–4.0)
Lymphocytes Relative: 34 % (ref 12–46)
MCH: 29.7 pg (ref 26.0–34.0)
MCHC: 33.4 g/dL (ref 30.0–36.0)
MCV: 89 fL (ref 78.0–100.0)
MONOS PCT: 8 % (ref 3–12)
MPV: 9.4 fL (ref 8.6–12.4)
Monocytes Absolute: 0.7 10*3/uL (ref 0.1–1.0)
NEUTROS PCT: 56 % (ref 43–77)
Neutro Abs: 4.6 10*3/uL (ref 1.7–7.7)
PLATELETS: 265 10*3/uL (ref 150–400)
RBC: 4.71 MIL/uL (ref 3.87–5.11)
RDW: 13.6 % (ref 11.5–15.5)
WBC: 8.2 10*3/uL (ref 4.0–10.5)

## 2015-12-31 LAB — LIPID PANEL
Cholesterol: 169 mg/dL (ref 125–200)
HDL: 55 mg/dL (ref >=46)
LDL Cholesterol: 80 mg/dL (ref <130)
Total CHOL/HDL Ratio: 3.1 Ratio (ref <=5.0)
Triglycerides: 171 mg/dL — ABNORMAL HIGH (ref <150)
VLDL: 34 mg/dL — ABNORMAL HIGH (ref <30)

## 2015-12-31 LAB — MAGNESIUM: MAGNESIUM: 1.9 mg/dL (ref 1.5–2.5)

## 2015-12-31 NOTE — Progress Notes (Addendum)
Patient ID: [name], female   DOB: 08-03-1931, 82 y.o.   MRN: [id number]     This very nice 82 y.o. MWF presents for 3 month follow up with Hypertension, Hyperlipidemia, Pre-Diabetes and Vitamin D Deficiency. Patient has hx/o ASHD, pAfib, SAH/ CVA, and OSA and had been intolerant to the face mask and declined to consider an intra oral Moses device.     Patient is treated for HTN since 1980 when she presented with a SAH/CVA and recovered w/o sequelae.  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 had BP has been controlled since and today's BP: 136/84 mmHg. Patient has had no complaints of any cardiac type chest pain, palpitations, dyspnea/orthopnea/PND, dizziness, claudication, or dependent edema.     Hyperlipidemia is controlled with diet & meds. Patient denies myalgias or other med SE's. Last Lipids were  Cholesterol 169; HDL 55; LDL 80; Triglycerides 171 on 12/31/2015.     Also, the patient has history of Morbid Obesity (BMI 41+) and consequent PreDiabetes since 2010 with A1c 6.5% and then in 2012 with A1c 6.4% and has had no symptoms of reactive hypoglycemia, diabetic polys, paresthesias or visual blurring.  Last A1c was  6.1% on 09/23/2015.      Further, the patient also has history of Vitamin D Deficiency of "12" in 2008  and supplements vitamin D without any suspected side-effects. Last vitamin D was  61 on 06/17/2015.        Lastly, patient & husband report today that they are in process of moving to [facility] area to reside closer to their only child (a daughter).   Medication Sig  . b complex vitamins  Take 1 tablet by mouth daily.  . bisoprolol-hctz 5-6.25 MG TAKE 1 TABLET EVERY MORNING  . VITAMIN D 5000 UNITS  Take 5,000 Units by mouth every other day. (Patient taking differently: Take 5,000 Units by mouth 2 (two) times a week. Takes on Monday and Friday per patient)  . ELIQUIS  5 MG  TAKE 1 TABLET TWICE A DAY  . furosemide  40 MG  Take 1 tablet 2 x daily as directed for BP & Fluid / Swelling  . VICODIN 5-325 Take 0.5-1 tablets by mouth every 3 (three) hours as needed for moderate pain.  Marland Kitchen KEPPRA 500 MG TAKE 1 TABLET THREE TIMES A DAY  . levothyroxine  100 MCG Take 1 tablet daily on an empty stomach for 30 minutes  . Magnesium 400 MG  Take 400 mg by mouth 2 times daily at 12 noon and 4 pm.   . ondansetron-ODT 8 MG  Take 1 tablet (8 mg total) by mouth every 8 (eight) hours as needed for nausea.  . Slow release iron Take 1 tablet by mouth every morning.   Marland Kitchen MIRALAX  Take 17 g by mouth daily.  . potassium chloride  20 MEQ TAKE 1 TABLET THREE TIMES A DAY  . ranitidine  300 MG  Take 1 to 2 tablets daily for indigestion/heartburn   . rOPINIRole2 MG tablet TAKE 1 TABLET AT BEDTIME  . rosuvastatin  40 MG tablet TAKE 1 TABLET DAILY   Allergies  Allergen Reactions  . Ace Inhibitors   . Atenolol   . Lyrica [Pregabalin] Other (See Comments)  . Trazodone And Nefazodone   . Adhesive [Tape] Rash   PMHx:   Past Medical History  Diagnosis Date  .  Thyroid disease   . GERD (gastroesophageal reflux disease)   . Arthritis     knees  . Anemia   . Cancer ([place])     throid ca  . CHF (congestive heart failure) ([place])   . Shortness of breath   . Hypertension   . Hyperlipidemia   . Elevated hemoglobin A1c   . SAH (subarachnoid hemorrhage) ([place]) 1973  . Vitamin D deficiency   . Hypothyroid   . RLS (restless legs syndrome)   . Stroke ([place]) 6-7 yrs ago  . Sleep apnea     cannot tolerate cpap  . Seizures ([place]) aug 2015    mild with arm jerking  . History of blood transfusion 2013   Immunization History  Administered Date(s) Administered  . DT 09/04/2014  . Influenza, High Dose Seasonal PF 07/23/2014, 07/21/2015  . Influenza-Unspecified 08/09/2013  . Pneumococcal Conjugate-13 06/10/2014  . Pneumococcal-Unspecified 10/25/2004  . Td 10/26/2003  . Zoster 10/26/2007    Past Surgical History  Procedure Laterality Date  . Abdominal hysterectomy    . Tonsillectomy    . Throidectomy      complete  . Knee surgery Bilateral     meniscus  . Esophagogastroduodenoscopy  07/03/2012    Procedure: ESOPHAGOGASTRODUODENOSCOPY (EGD);  Surgeon: Arta Silence, MD;  Location: [facility];  Service: Endoscopy;  Laterality: N/A;  . Appendectomy  1960  . Eye surgery Bilateral     lens replacments for cataracts  . Esophagogastroduodenoscopy (egd) with propofol N/A 07/30/2014    Procedure: ESOPHAGOGASTRODUODENOSCOPY (EGD) WITH PROPOFOL;  Surgeon: Jeryl Columbia, MD;  Location: [facility];  Service: Endoscopy;  Laterality: N/A;  . Hot hemostasis N/A 07/30/2014    Procedure: HOT HEMOSTASIS (ARGON PLASMA COAGULATION/BICAP);  Surgeon: Jeryl Columbia, MD;  Location: [facility];  Service: Endoscopy;  Laterality: N/A;   FHx:    Reviewed / unchanged  SHx:    Reviewed / unchanged  Systems Review:  Constitutional: Denies fever, chills, wt changes, headaches, insomnia, fatigue, night sweats, change in appetite. Eyes: Denies redness, blurred vision, diplopia, discharge, itchy, watery eyes.  ENT: Denies discharge, congestion, post nasal drip, epistaxis, sore throat, earache, hearing loss, dental pain, tinnitus, vertigo, sinus pain, snoring.  CV: Denies chest pain, palpitations, irregular heartbeat, syncope, dyspnea, diaphoresis, orthopnea, PND, claudication or edema. Respiratory: denies cough, dyspnea, DOE, pleurisy, hoarseness, laryngitis, wheezing.  Gastrointestinal: Denies dysphagia, odynophagia, heartburn, reflux, water brash, abdominal pain or cramps, nausea, vomiting, bloating, diarrhea, constipation, hematemesis, melena, hematochezia  or hemorrhoids. Genitourinary: Denies dysuria, frequency, urgency, nocturia, hesitancy, discharge, hematuria or flank pain. Musculoskeletal: Denies arthralgias, myalgias, stiffness, jt. swelling, pain, limping or strain/sprain.  Skin: Denies  pruritus, rash, hives, warts, acne, eczema or change in skin lesion(s). Neuro: No weakness, tremor, incoordination, spasms, paresthesia or pain. Psychiatric: Denies confusion, memory loss or sensory loss. Endo: Denies change in weight, skin or hair change.  Heme/Lymph: No excessive bleeding, bruising or enlarged lymph nodes.  Physical Exam  BP 136/84 mmHg  Pulse 72  Temp(Src) 97.6 F (36.4 C)  Resp 16  Ht 5\' 4"  (1.626 m)  Wt 240 lb 12.8 oz (109.226 kg)  BMI 41.31 kg/m2  Appears over nourished with central  and in no distress. Eyes: PERRLA, EOMs, conjunctiva no swelling or erythema. Sinuses: No frontal/maxillary tenderness ENT/Mouth: EAC's clear, TM's nl w/o erythema, bulging. Nares clear w/o erythema, swelling, exudates. Oropharynx clear without erythema or exudates. Oral hygiene is good. Tongue normal, non obstructing. Hearing intact.  Neck: Supple. Thyroid nl. Car 2+/2+ without bruits, nodes  or JVD. Chest: Respirations nl with BS clear & equal w/o rales, rhonchi, wheezing or stridor.  Cor: Heart sounds normal w/ regular rate and rhythm without sig. murmurs, gallops, clicks, or rubs. Peripheral pulses normal and equal  without edema.  Abdomen: Soft & bowel sounds normal. Non-tender w/o guarding, rebound, hernias, masses, or organomegaly.  Lymphatics: Unremarkable.  Musculoskeletal: Full ROM all peripheral extremities, joint stability, 5/5 strength, and normal gait.  Skin: Warm, dry without exposed rashes, lesions or ecchymosis apparent.  Neuro: Cranial nerves intact, reflexes equal bilaterally. Sensory-motor testing grossly intact. Tendon reflexes grossly intact.  Pysch: Alert & oriented x 3.  Insight and judgement nl & appropriate. No ideations.  Assessment and Plan:  1. Essential hypertension  - TSH  2. Hyperlipidemia  - Lipid panel - TSH  3. PreDiabetes  - Hemoglobin A1c - Insulin, random  4. Vitamin D deficiency  - VITAMIN D 25 Hydroxy   5.  Hypothyroidism   6. Medication management  - CBC with Differential/Platelet - BASIC METABOLIC PANEL WITH GFR - Hepatic function panel - Magnesium   Recommended regular exercise, BP monitoring, weight control, and discussed med and SE's. Recommended labs to assess and monitor clinical status. Further disposition pending results of labs. Over 30 minutes of exam, counseling, chart review was performed

## 2015-12-31 NOTE — Patient Instructions (Signed)

## 2016-01-01 LAB — INSULIN, RANDOM: INSULIN: 7.4 u[IU]/mL (ref 2.0–19.6)

## 2016-01-01 LAB — VITAMIN D 25 HYDROXY (VIT D DEFICIENCY, FRACTURES): VIT D 25 HYDROXY: 60 ng/mL (ref 30–100)

## 2016-01-01 LAB — HEMOGLOBIN A1C
HEMOGLOBIN A1C: 6.1 % — AB (ref <5.7)
MEAN PLASMA GLUCOSE: 128 mg/dL — AB (ref <117)

## 2016-01-07 ENCOUNTER — Encounter: Payer: Self-pay | Admitting: Internal Medicine

## 2016-01-07 ENCOUNTER — Ambulatory Visit (INDEPENDENT_AMBULATORY_CARE_PROVIDER_SITE_OTHER): Payer: Medicare Other | Admitting: Internal Medicine

## 2016-01-07 VITALS — BP 128/70 | HR 94 | Temp 98.0°F | Resp 16 | Ht 64.0 in

## 2016-01-07 DIAGNOSIS — J069 Acute upper respiratory infection, unspecified: Secondary | ICD-10-CM | POA: Diagnosis not present

## 2016-01-07 MED ORDER — AZITHROMYCIN 250 MG PO TABS: ORAL_TABLET | ORAL | Status: AC

## 2016-01-07 MED ORDER — BENZONATATE 200 MG PO CAPS: 200.0000 mg | ORAL_CAPSULE | Freq: Three times a day (TID) | ORAL | Status: AC | PRN

## 2016-01-07 MED ORDER — CHLORPHEN-PE-ACETAMINOPHEN 4-10-325 MG PO TABS: 1.0000 | ORAL_TABLET | Freq: Four times a day (QID) | ORAL | Status: AC | PRN

## 2016-01-07 MED ORDER — PREDNISONE 20 MG PO TABS
ORAL_TABLET | ORAL | Status: AC
Start: 2016-01-07 — End: ?

## 2016-01-07 NOTE — Progress Notes (Signed)
HPI  Patient presents to the office for evaluation of cough.  It has been going on for 4 days.  Patient reports night > day, wet, worse with lying down.  They also endorse change in voice, chills, fever, postnasal drip, shortness of breath, sputum production, wheezing and nasal congestion, rhinorrhea.  .  They have tried antitussives.  They report that nothing has worked.  They admits to other sick contacts.  She was recently seen at our offices.     Review of Systems  Constitutional: Positive for malaise/fatigue. Negative for fever and chills.  HENT: Positive for congestion and ear pain. Negative for sore throat.   Respiratory: Positive for cough, sputum production, shortness of breath and wheezing.   Cardiovascular: Negative for chest pain, palpitations and leg swelling.  Neurological: Positive for headaches.    PE:  Filed Vitals:   01/07/16 1408  BP: 128/70  Pulse: 94  Temp: 98 F (36.7 C)  Resp: 16   General:  Alert and non-toxic, WDWN, NAD HEENT: NCAT, PERLA, EOM normal, no occular discharge or erythema.  Nasal mucosal edema with sinus tenderness to palpation.  Oropharynx clear with minimal oropharyngeal edema and erythema.  Mucous membranes moist and pink. Neck:  Cervical adenopathy Chest:  RRR no MRGs.  Lungs clear to auscultation A&P with no wheezes rhonchi or rales.   Abdomen: +BS x 4 quadrants, soft, non-tender, no guarding, rigidity, or rebound. Skin: warm and dry no rash Neuro: A&Ox4, CN II-XII grossly intact  Assessment and Plan:   1. Acute URI -nasal saline -fluids - predniSONE (DELTASONE) 20 MG tablet; 3 tabs po daily x 3 days, then 2 tabs x 3 days, then 1.5 tabs x 3 days, then 1 tab x 3 days, then 0.5 tabs x 3 days  Dispense: 27 tablet; Refill: 0 - azithromycin (ZITHROMAX Z-PAK) 250 MG tablet; 2 po day one, then 1 daily x 4 days  Dispense: 6 tablet; Refill: 0 - benzonatate (TESSALON) 200 MG capsule; Take 1 capsule (200 mg total) by mouth 3 (three) times daily as  needed for cough.  Dispense: 30 capsule; Refill: 0 - Chlorphen-PE-Acetaminophen 4-10-325 MG TABS; Take 1 tablet by mouth every 6 (six) hours as needed (for cold symptoms).  Dispense: 84 tablet; Refill: 0

## 2016-01-07 NOTE — Patient Instructions (Signed)
Please take tessalon only if the chloraceden HBP syrup is not working at home.  Please use saline in your nose as often as you can  Please finish the prednisone as prescribed.  Please finish the zpak  You can take norel ad up 4 times per day.  Please use the breo once daily and wash your mouth out after using.

## 2016-02-12 ENCOUNTER — Other Ambulatory Visit: Payer: Self-pay | Admitting: Internal Medicine

## 2016-03-09 ENCOUNTER — Encounter: Payer: Self-pay | Admitting: Internal Medicine

## 2016-04-05 ENCOUNTER — Encounter: Payer: Self-pay | Admitting: Internal Medicine

## 2016-05-04 ENCOUNTER — Other Ambulatory Visit: Payer: Self-pay | Admitting: Internal Medicine

## 2016-05-19 ENCOUNTER — Other Ambulatory Visit: Payer: Self-pay | Admitting: Internal Medicine

## 2016-05-19 DIAGNOSIS — I10 Essential (primary) hypertension: Secondary | ICD-10-CM

## 2016-06-12 ENCOUNTER — Other Ambulatory Visit: Payer: Self-pay | Admitting: Internal Medicine

## 2016-06-21 ENCOUNTER — Other Ambulatory Visit: Payer: Self-pay | Admitting: Internal Medicine

## 2017-02-22 ENCOUNTER — Emergency Department (HOSPITAL_COMMUNITY): Payer: Medicare Other

## 2017-02-22 ENCOUNTER — Encounter (HOSPITAL_COMMUNITY): Payer: Self-pay | Admitting: Emergency Medicine

## 2017-02-22 ENCOUNTER — Emergency Department (HOSPITAL_COMMUNITY)
Admission: EM | Admit: 2017-02-22 | Discharge: 2017-02-22 | Disposition: A | Discharge status: Home / Self Care | Payer: Medicare Other | Attending: Emergency Medicine | Admitting: Emergency Medicine

## 2017-02-22 DIAGNOSIS — R1084 Generalized abdominal pain: Secondary | ICD-10-CM | POA: Insufficient documentation

## 2017-02-22 DIAGNOSIS — Z79899 Other long term (current) drug therapy: Secondary | ICD-10-CM | POA: Insufficient documentation

## 2017-02-22 DIAGNOSIS — I5030 Unspecified diastolic (congestive) heart failure: Secondary | ICD-10-CM | POA: Insufficient documentation

## 2017-02-22 DIAGNOSIS — Z7901 Long term (current) use of anticoagulants: Secondary | ICD-10-CM | POA: Insufficient documentation

## 2017-02-22 DIAGNOSIS — Z8673 Personal history of transient ischemic attack (TIA), and cerebral infarction without residual deficits: Secondary | ICD-10-CM | POA: Insufficient documentation

## 2017-02-22 DIAGNOSIS — K625 Hemorrhage of anus and rectum: Secondary | ICD-10-CM | POA: Diagnosis present

## 2017-02-22 DIAGNOSIS — Z85850 Personal history of malignant neoplasm of thyroid: Secondary | ICD-10-CM | POA: Insufficient documentation

## 2017-02-22 DIAGNOSIS — R197 Diarrhea, unspecified: Secondary | ICD-10-CM | POA: Insufficient documentation

## 2017-02-22 DIAGNOSIS — E039 Hypothyroidism, unspecified: Secondary | ICD-10-CM | POA: Insufficient documentation

## 2017-02-22 DIAGNOSIS — I110 Hypertensive heart disease with heart failure: Secondary | ICD-10-CM | POA: Insufficient documentation

## 2017-02-22 DIAGNOSIS — Z87891 Personal history of nicotine dependence: Secondary | ICD-10-CM | POA: Insufficient documentation

## 2017-02-22 LAB — COMPREHENSIVE METABOLIC PANEL
ALT: 13 U/L — AB (ref 14–54)
ANION GAP: 12 (ref 5–15)
AST: 29 U/L (ref 15–41)
Albumin: 3.9 g/dL (ref 3.5–5.0)
Alkaline Phosphatase: 74 U/L (ref 38–126)
BUN: 15 mg/dL (ref 6–20)
CHLORIDE: 100 mmol/L — AB (ref 101–111)
CO2: 30 mmol/L (ref 22–32)
Calcium: 9.7 mg/dL (ref 8.9–10.3)
Creatinine, Ser: 1.09 mg/dL — ABNORMAL HIGH (ref 0.44–1.00)
GFR, EST AFRICAN AMERICAN: 53 mL/min — AB (ref >60)
GFR, EST NON AFRICAN AMERICAN: 46 mL/min — AB (ref >60)
Glucose, Bld: 117 mg/dL — ABNORMAL HIGH (ref 65–99)
POTASSIUM: 3.5 mmol/L (ref 3.5–5.1)
SODIUM: 142 mmol/L (ref 135–145)
Total Bilirubin: 0.5 mg/dL (ref 0.3–1.2)
Total Protein: 7.2 g/dL (ref 6.5–8.1)

## 2017-02-22 LAB — CBC
HEMATOCRIT: 43.7 % (ref 36.0–46.0)
HEMOGLOBIN: 14.7 g/dL (ref 12.0–15.0)
MCH: 30.5 pg (ref 26.0–34.0)
MCHC: 33.6 g/dL (ref 30.0–36.0)
MCV: 90.7 fL (ref 78.0–100.0)
PLATELETS: 244 10*3/uL (ref 150–400)
RBC: 4.82 MIL/uL (ref 3.87–5.11)
RDW: 13.3 % (ref 11.5–15.5)
WBC: 11 10*3/uL — AB (ref 4.0–10.5)

## 2017-02-22 LAB — TYPE AND SCREEN
ABO/RH(D): O POS
Antibody Screen: NEGATIVE

## 2017-02-22 LAB — POC OCCULT BLOOD, ED: FECAL OCCULT BLD: NEGATIVE

## 2017-02-22 MED ORDER — IOPAMIDOL (ISOVUE-300) INJECTION 61%
INTRAVENOUS | Status: AC
  Administered 2017-02-22: 30 mL via ORAL
  Filled 2017-02-22: qty 30

## 2017-02-22 MED ORDER — IOPAMIDOL (ISOVUE-300) INJECTION 61%
INTRAVENOUS | Status: AC
  Filled 2017-02-22: qty 100

## 2017-02-22 MED ORDER — METRONIDAZOLE 500 MG PO TABS: 500.0000 mg | ORAL_TABLET | Freq: Three times a day (TID) | ORAL | 0 refills | Status: AC

## 2017-02-22 MED ORDER — IOPAMIDOL (ISOVUE-300) INJECTION 61%
100.0000 mL | Freq: Once | INTRAVENOUS | Status: AC | PRN
  Administered 2017-02-22: 100 mL via INTRAVENOUS

## 2017-02-22 MED ORDER — CIPROFLOXACIN HCL 500 MG PO TABS: 500.0000 mg | ORAL_TABLET | Freq: Two times a day (BID) | ORAL | 0 refills | Status: AC

## 2017-02-22 MED ORDER — IOPAMIDOL (ISOVUE-300) INJECTION 61%
30.0000 mL | Freq: Once | INTRAVENOUS | Status: AC | PRN
  Administered 2017-02-22: 30 mL via ORAL

## 2017-02-22 NOTE — ED Triage Notes (Signed)
Pt states she has had diarrhea since Sunday and has been taking pepto bismol pills  Pt states she started having black tar colored watery stools last night  Denies any abd pain, nausea, or vomiting   Pt states she has had bleeding polyps in the past

## 2017-02-22 NOTE — ED Provider Notes (Signed)
[facility] DEPT Provider Note   CSN: [id number] Arrival date & time: 02/22/17  8127     History   Chief Complaint Chief Complaint  Patient presents with  . Rectal Bleeding    HPI [name] is a 83 y.o. female.  HPI  83 year old Caucasian female pmh sig for chf, GERD, hypertension that presents to the ED today with complaint of generalized abd pain, rectal bleeding, diarrhea. Pt states that she has had watery stools for the past night and has been taking pepto bismol. Last night she noticed dary tarry looking stools and decided to come to the ED for possible blood in her stool. She has had bleeding polyps in the past. Endorses gen abd pain that is cramping in nature, and intermittent. Nothing makes better or worse. Denies any recent abx use, travel out of the [place], new foods. Denies any sick contacts. Pt able to tolerate po fluids. Denies any fever, lightheadedness, dizziness, cp, sob, urinary symptoms, nausea, emesis. Past Medical History:  Diagnosis Date  . Anemia   . Arthritis    knees  . Cancer ([place])    throid ca  . CHF (congestive heart failure) ([place])   . Elevated hemoglobin A1c   . GERD (gastroesophageal reflux disease)   . History of blood transfusion 2013  . Hyperlipidemia   . Hypertension   . Hypothyroid   . RLS (restless legs syndrome)   . SAH (subarachnoid hemorrhage) ([place]) 1973  . Seizures ([place]) aug 2015   mild with arm jerking  . Shortness of breath   . Sleep apnea    cannot tolerate cpap  . Stroke ([place]) 6-7 yrs ago  . Thyroid disease   . Vitamin D deficiency     Patient Active Problem List   Diagnosis Date Noted  . History of skin cancer 12/05/2014  . Morbid obesity ([place]) 12/05/2014  . Vitamin D deficiency 02/28/2014  . Medication management 02/28/2014  . Chronic anticoagulation 02/01/2014  . Hypertension (1980)   . Hyperlipidemia   . PreDiabetes   . SAH (subarachnoid hemorrhage) ([place])   . RLS (restless legs syndrome)   . Seizures ([place])     . Chronic a-fib (2009)  07/03/2012  . Diastolic CHF, chronic ([place]) 07/03/2012  . Hypothyroidism 07/03/2012    Past Surgical History:  Procedure Laterality Date  . ABDOMINAL HYSTERECTOMY    . APPENDECTOMY  1960  . ESOPHAGOGASTRODUODENOSCOPY  07/03/2012   Procedure: ESOPHAGOGASTRODUODENOSCOPY (EGD);  Surgeon: Arta Silence, MD;  Location: [facility];  Service: Endoscopy;  Laterality: N/A;  . ESOPHAGOGASTRODUODENOSCOPY (EGD) WITH PROPOFOL N/A 07/30/2014   Procedure: ESOPHAGOGASTRODUODENOSCOPY (EGD) WITH PROPOFOL;  Surgeon: Jeryl Columbia, MD;  Location: [facility] ENDOSCOPY;  Service: Endoscopy;  Laterality: N/A;  . EYE SURGERY Bilateral    lens replacments for cataracts  . HOT HEMOSTASIS N/A 07/30/2014   Procedure: HOT HEMOSTASIS (ARGON PLASMA COAGULATION/BICAP);  Surgeon: Jeryl Columbia, MD;  Location: [facility];  Service: Endoscopy;  Laterality: N/A;  . KNEE SURGERY Bilateral    meniscus  . throidectomy     complete  . TONSILLECTOMY      OB History    No data available       Home Medications    Prior to Admission medications   Medication Sig Start Date End Date Taking? Authorizing Provider  b complex vitamins tablet Take 1 tablet by mouth daily. 09/04/13  Yes Unk Pinto, MD  bisoprolol-hydrochlorothiazide [facility]) 5-6.25 MG tablet TAKE 1 TABLET EVERY MORNING 12/24/15  Yes Unk Pinto, MD  Cholecalciferol (VITAMIN D3) 5000 UNITS CAPS Take 5,000 Units by mouth every other day. 09/04/13  Yes Unk Pinto, MD  ELIQUIS 5 MG TABS tablet TAKE 1 TABLET TWICE A DAY 12/14/15  Yes Unk Pinto, MD  furosemide (LASIX) 40 MG tablet Take 40 mg by mouth daily.   Yes Historical Provider, MD  levETIRAcetam (KEPPRA) 500 MG tablet TAKE 1 TABLET THREE TIMES A DAY 11/19/15  Yes Unk Pinto, MD  levothyroxine (SYNTHROID, LEVOTHROID) 100 MCG tablet Take 100 mcg by mouth daily before breakfast.   Yes Historical Provider, MD  Magnesium 400 MG CAPS Take 400 mg by mouth 2 times daily at 12 noon  and 4 pm.    Yes Historical Provider, MD  ondansetron (ZOFRAN-ODT) 8 MG disintegrating tablet Take 1 tablet (8 mg total) by mouth every 8 (eight) hours as needed for nausea. 08/11/13  Yes Charlynne Cousins, MD  OVER THE COUNTER MEDICATION Take 1 tablet by mouth every morning. Slow release iron   Yes Historical Provider, MD  polyethylene glycol (MIRALAX / GLYCOLAX) packet Take 17 g by mouth daily.   Yes Historical Provider, MD  potassium chloride SA (K-DUR,KLOR-CON) 20 MEQ tablet TAKE 1 TABLET THREE TIMES A DAY Patient taking differently: TAKE 1 TABLET DAILY 08/07/15  Yes Unk Pinto, MD  ranitidine (ZANTAC) 300 MG tablet Take 1 to 2 tablets daily for indigestion/heartburn to allow wean off of pantoprazole / Protonix 03/07/15 02/22/17 Yes Unk Pinto, MD  rOPINIRole (REQUIP) 2 MG tablet TAKE 1 TABLET AT BEDTIME 02/13/16  Yes Unk Pinto, MD  rosuvastatin (CRESTOR) 40 MG tablet TAKE 1 TABLET DAILY 11/19/15  Yes Unk Pinto, MD  azithromycin (ZITHROMAX Z-PAK) 250 MG tablet 2 po day one, then 1 daily x 4 days Patient not taking: Reported on 02/22/2017 01/07/16   Loma Sousa Forcucci, PA-C  benzonatate (TESSALON) 200 MG capsule Take 1 capsule (200 mg total) by mouth 3 (three) times daily as needed for cough. Patient not taking: Reported on 02/22/2017 01/07/16   Starlyn Skeans, PA-C  Chlorphen-PE-Acetaminophen 4-10-325 MG TABS Take 1 tablet by mouth every 6 (six) hours as needed (for cold symptoms). Patient not taking: Reported on 02/22/2017 01/07/16   Loma Sousa Forcucci, PA-C  ciprofloxacin (CIPRO) 500 MG tablet Take 1 tablet (500 mg total) by mouth 2 (two) times daily. 02/22/17   Doristine Devoid, PA-C  metroNIDAZOLE (FLAGYL) 500 MG tablet Take 1 tablet (500 mg total) by mouth 3 (three) times daily. 02/22/17   Doristine Devoid, PA-C  predniSONE (DELTASONE) 20 MG tablet 3 tabs po daily x 3 days, then 2 tabs x 3 days, then 1.5 tabs x 3 days, then 1 tab x 3 days, then 0.5 tabs x 3 days Patient not  taking: Reported on 02/22/2017 01/07/16   Starlyn Skeans, PA-C    Family History Family History  Problem Relation Age of Onset  . Heart attack Mother   . Diabetes Mother   . Heart disease Mother   . Heart attack Father   . Hypertension Father   . Heart disease Father   . Heart disease Brother   . Heart disease Sister   . CVA Sister   . Heart disease Brother   . Cancer Brother     colon  . Hyperlipidemia Brother   . Heart disease Sister     Social History Social History  Substance Use Topics  . Smoking status: Former Smoker    Packs/day: 1.00    Years: 20.00    Types: Cigarettes  Quit date: 11/03/1984  . Smokeless tobacco: Never Used  . Alcohol use No     Allergies   Ace inhibitors; Atenolol; Lyrica [pregabalin]; Trazodone and nefazodone; and Adhesive [tape]   Review of Systems Review of Systems  Constitutional: Negative for chills and fever.  HENT: Negative for congestion.   Eyes: Negative for visual disturbance.  Respiratory: Negative for cough.   Cardiovascular: Negative for chest pain.  Gastrointestinal: Positive for abdominal pain, blood in stool and diarrhea. Negative for nausea and vomiting.  Genitourinary: Negative for frequency, hematuria and urgency.  Musculoskeletal: Negative for back pain.  Skin: Negative for wound.  Neurological: Negative for dizziness, syncope, weakness, light-headedness and headaches.     Physical Exam Updated Vital Signs BP (!) 155/73 (BP Location: Left Arm)   Pulse 94   Temp 97.5 F (36.4 C) (Oral)   Resp 16   Ht 5\' 4"  (1.626 m)   Wt 107.5 kg   SpO2 97%   BMI 40.68 kg/m   Physical Exam  Constitutional: She is oriented to person, place, and time. She appears well-developed and well-nourished. No distress.  HENT:  Head: Normocephalic and atraumatic.  Mouth/Throat: Oropharynx is clear and moist.  Eyes: Conjunctivae are normal. Right eye exhibits no discharge. Left eye exhibits no discharge. No scleral icterus.   Neck: Normal range of motion. Neck supple. No thyromegaly present.  Cardiovascular: Normal rate, regular rhythm, normal heart sounds and intact distal pulses.   Pulmonary/Chest: Effort normal and breath sounds normal. No respiratory distress.  Abdominal: Soft. She exhibits no distension. Bowel sounds are increased. There is generalized tenderness. There is no rigidity, no rebound, no guarding and no CVA tenderness.  Musculoskeletal: Normal range of motion.  Lymphadenopathy:    She has no cervical adenopathy.  Neurological: She is alert and oriented to person, place, and time.  Skin: Skin is warm and dry. Capillary refill takes less than 2 seconds.  Nursing note and vitals reviewed.    ED Treatments / Results  Labs (all labs ordered are listed, but only abnormal results are displayed) Labs Reviewed  COMPREHENSIVE METABOLIC PANEL - Abnormal; Notable for the following:       Result Value   Chloride 100 (*)    Glucose, Bld 117 (*)    Creatinine, Ser 1.09 (*)    ALT 13 (*)    GFR calc non Af Amer 46 (*)    GFR calc Af Amer 53 (*)    All other components within normal limits  CBC - Abnormal; Notable for the following:    WBC 11.0 (*)    All other components within normal limits  POC OCCULT BLOOD, ED  TYPE AND SCREEN    EKG  EKG Interpretation None       Radiology Ct Abdomen Pelvis W Contrast  Result Date: 02/22/2017 CLINICAL DATA:  Diarrhea. EXAM: CT ABDOMEN AND PELVIS WITH CONTRAST TECHNIQUE: Multidetector CT imaging of the abdomen and pelvis was performed using the standard protocol following bolus administration of intravenous contrast. CONTRAST:  127mL ISOVUE-300 IOPAMIDOL (ISOVUE-300) INJECTION 61%, 57mL ISOVUE-300 IOPAMIDOL (ISOVUE-300) INJECTION 61% COMPARISON:  CT scan of October 24, 2013. FINDINGS: Lower chest: No acute abnormality. Hepatobiliary: Cholelithiasis is noted without evidence of inflammation. No abnormality seen in the liver. Pancreas: Unremarkable. No  pancreatic ductal dilatation or surrounding inflammatory changes. Spleen: Normal in size without focal abnormality. Stable irregular shape is noted most consistent with prior traumatic injury or infarction. Adrenals/Urinary Tract: Adrenal glands are unremarkable. Bilateral simple renal cysts are noted.  No hydronephrosis or renal obstruction is noted. No renal or ureteral calculi are noted. Urinary bladder is unremarkable. Stomach/Bowel: There is no evidence of bowel obstruction. Patient is status post appendectomy. Sigmoid diverticulosis is noted. There is noted wall thickening of the sigmoid colon with minimal surrounding inflammatory changes suggesting some degree of colitis or diverticulitis. Vascular/Lymphatic: Aortic atherosclerosis. No enlarged abdominal or pelvic lymph nodes. Reproductive: Status post hysterectomy. No adnexal masses. Other: No abdominal wall hernia or abnormality. No abdominopelvic ascites. Musculoskeletal: Multilevel degenerative disc disease is noted in the lumbar spine. IMPRESSION: Cholelithiasis without evidence of inflammation. Stable bilateral renal cysts. Aortic atherosclerosis. Sigmoid diverticulosis is noted. Mild wall thickening of the sigmoid colon is noted with minimal surrounding inflammation suggesting some degree of colitis or diverticulitis. Electronically Signed   By: Marijo Conception, M.D.   On: 02/22/2017 09:26    Procedures Procedures (including critical care time)  Medications Ordered in ED Medications  iopamidol (ISOVUE-300) 61 % injection 30 mL (30 mLs Oral Contrast Given 02/22/17 0736)  iopamidol (ISOVUE-300) 61 % injection 100 mL (100 mLs Intravenous Contrast Given 02/22/17 0853)     Initial Impression / Assessment and Plan / ED Course  I have reviewed the triage vital signs and the nursing notes.  Pertinent labs & imaging results that were available during my care of the patient were reviewed by me and considered in my medical decision making (see chart  for details).     Pt presents with rectal bleeding, generalized abd pain, and diarrhea. Patient is nontoxic, nonseptic appearing, in no apparent distress.  Patient's pain and other symptoms adequately managed in emergency department.  Labs, imaging and vitals reviewed.  Patient does not meet the SIRS or Sepsis criteria. No signs of dehydration. Mild leukocytosis noted. All other labs at baseline. Occult blood negative. Black stool likely due to peptobismol.  On repeat exam patient does not have a surgical abdomin and there are no peritoneal signs.  CT scan obtained due to generalized tenderness that showed sigmoid diverticulosis is noted with mild wall thickening of the sigmoid colon is noted with minimal surrounding inflammation suggesting some degree of colitis or diverticulitis. Also noted cholelithiasis without acute cholecystitis.  Given ct findins will start pt on cipro and flagyl. Patient discharged home with symptomatic treatment and given strict instructions for follow-up with their primary care physician.  I have also discussed reasons to return immediately to the ER.  Patient expresses understanding and agrees with plan. Pt dicussed and seen by Dr. Lacinda Axon is agreeable to the above plan.      Final Clinical Impressions(s) / ED Diagnoses   Final diagnoses:  Diarrhea, unspecified type  Generalized abdominal pain    New Prescriptions Discharge Medication List as of 02/22/2017 11:09 AM    START taking these medications   Details  ciprofloxacin (CIPRO) 500 MG tablet Take 1 tablet (500 mg total) by mouth 2 (two) times daily., Starting Tue 02/22/2017, Print    metroNIDAZOLE (FLAGYL) 500 MG tablet Take 1 tablet (500 mg total) by mouth 3 (three) times daily., Starting Tue 02/22/2017, Print         Doristine Devoid, PA-C 02/23/17 2058    Nat Christen, MD 02/26/17 [phone]

## 2017-02-22 NOTE — Discharge Instructions (Signed)
Your stool was negative for blood. The dark stool is likely due to the Pepto-Bismol. Your CAT scan shows possible viral intestinal infection versus diverticulitis. We'll start on seven-day course of antibiotics. Make sure you follow up with her primary care doctor. Return to the ED if you develop worsening symptoms including fever, bloody stools, worsening vomiting or for any other reason.

## 2017-12-23 DEATH — deceased
